# Patient Record
Sex: Male | Born: 1940 | Race: White | Hispanic: No | Smoking: Never smoker
Health system: Southern US, Community
[De-identification: ages and names within clinical notes are randomized; demographics above are authoritative.]

## PROBLEM LIST (undated history)

## (undated) DIAGNOSIS — R109 Unspecified abdominal pain: Secondary | ICD-10-CM

## (undated) DIAGNOSIS — G629 Polyneuropathy, unspecified: Secondary | ICD-10-CM

## (undated) DIAGNOSIS — F329 Major depressive disorder, single episode, unspecified: Secondary | ICD-10-CM

## (undated) DIAGNOSIS — K5790 Diverticulosis of intestine, part unspecified, without perforation or abscess without bleeding: Secondary | ICD-10-CM

## (undated) DIAGNOSIS — C4491 Basal cell carcinoma of skin, unspecified: Secondary | ICD-10-CM

## (undated) DIAGNOSIS — L570 Actinic keratosis: Secondary | ICD-10-CM

## (undated) DIAGNOSIS — M1990 Unspecified osteoarthritis, unspecified site: Secondary | ICD-10-CM

## (undated) DIAGNOSIS — H409 Unspecified glaucoma: Secondary | ICD-10-CM

## (undated) DIAGNOSIS — K900 Celiac disease: Secondary | ICD-10-CM

## (undated) DIAGNOSIS — E039 Hypothyroidism, unspecified: Secondary | ICD-10-CM

## (undated) DIAGNOSIS — E8881 Metabolic syndrome: Secondary | ICD-10-CM

## (undated) DIAGNOSIS — E119 Type 2 diabetes mellitus without complications: Secondary | ICD-10-CM

## (undated) DIAGNOSIS — K219 Gastro-esophageal reflux disease without esophagitis: Secondary | ICD-10-CM

## (undated) DIAGNOSIS — I10 Essential (primary) hypertension: Secondary | ICD-10-CM

## (undated) DIAGNOSIS — E785 Hyperlipidemia, unspecified: Secondary | ICD-10-CM

## (undated) HISTORY — DX: Metabolic syndrome: E88.810

## (undated) HISTORY — PX: CHOLECYSTECTOMY: SHX55

## (undated) HISTORY — DX: Hyperlipidemia, unspecified: E78.5

## (undated) HISTORY — PX: COLONOSCOPY: SHX174

## (undated) HISTORY — DX: Unspecified osteoarthritis, unspecified site: M19.90

## (undated) HISTORY — DX: Unspecified abdominal pain: R10.9

## (undated) HISTORY — DX: Basal cell carcinoma of skin, unspecified: C44.91

## (undated) HISTORY — DX: Diverticulosis of intestine, part unspecified, without perforation or abscess without bleeding: K57.90

## (undated) HISTORY — DX: Essential (primary) hypertension: I10

## (undated) HISTORY — PX: OTHER SURGICAL HISTORY: SHX169

## (undated) HISTORY — DX: Hypothyroidism, unspecified: E03.9

## (undated) HISTORY — DX: Celiac disease: K90.0

## (undated) HISTORY — DX: Gastro-esophageal reflux disease without esophagitis: K21.9

## (undated) HISTORY — DX: Metabolic syndrome: E88.81

## (undated) HISTORY — DX: Type 2 diabetes mellitus without complications: E11.9

## (undated) HISTORY — DX: Major depressive disorder, single episode, unspecified: F32.9

## (undated) HISTORY — PX: ESOPHAGOGASTRODUODENOSCOPY: SHX1529

## (undated) HISTORY — DX: Actinic keratosis: L57.0

---

## 1985-09-23 HISTORY — PX: BACK SURGERY: SHX140

## 1986-09-23 HISTORY — PX: CATARACT EXTRACTION: SUR2

## 2007-09-28 ENCOUNTER — Ambulatory Visit: Payer: Self-pay | Admitting: Cardiology

## 2009-06-19 ENCOUNTER — Encounter: Admission: RE | Admit: 2009-06-19 | Discharge: 2009-06-19 | Payer: Self-pay | Admitting: Internal Medicine

## 2014-04-07 DIAGNOSIS — R0602 Shortness of breath: Secondary | ICD-10-CM | POA: Insufficient documentation

## 2014-07-25 ENCOUNTER — Telehealth: Payer: Self-pay | Admitting: Neurology

## 2014-07-25 ENCOUNTER — Ambulatory Visit: Payer: Medicare Other | Admitting: Neurology

## 2014-07-25 NOTE — Telephone Encounter (Signed)
Patient is a no show for today's appointment(07/25/14)

## 2014-07-27 ENCOUNTER — Encounter: Payer: Self-pay | Admitting: Neurology

## 2014-11-03 DIAGNOSIS — I10 Essential (primary) hypertension: Secondary | ICD-10-CM | POA: Diagnosis not present

## 2014-11-03 DIAGNOSIS — K210 Gastro-esophageal reflux disease with esophagitis: Secondary | ICD-10-CM | POA: Diagnosis not present

## 2014-11-03 DIAGNOSIS — E1165 Type 2 diabetes mellitus with hyperglycemia: Secondary | ICD-10-CM | POA: Diagnosis not present

## 2014-11-03 DIAGNOSIS — E782 Mixed hyperlipidemia: Secondary | ICD-10-CM | POA: Diagnosis not present

## 2014-11-03 DIAGNOSIS — E039 Hypothyroidism, unspecified: Secondary | ICD-10-CM | POA: Diagnosis not present

## 2014-11-09 DIAGNOSIS — E1165 Type 2 diabetes mellitus with hyperglycemia: Secondary | ICD-10-CM | POA: Diagnosis not present

## 2014-11-11 DIAGNOSIS — Z1389 Encounter for screening for other disorder: Secondary | ICD-10-CM | POA: Diagnosis not present

## 2014-11-11 DIAGNOSIS — E782 Mixed hyperlipidemia: Secondary | ICD-10-CM | POA: Diagnosis not present

## 2014-11-11 DIAGNOSIS — Z0001 Encounter for general adult medical examination with abnormal findings: Secondary | ICD-10-CM | POA: Diagnosis not present

## 2015-03-06 DIAGNOSIS — E8881 Metabolic syndrome: Secondary | ICD-10-CM | POA: Diagnosis not present

## 2015-03-06 DIAGNOSIS — E1165 Type 2 diabetes mellitus with hyperglycemia: Secondary | ICD-10-CM | POA: Diagnosis not present

## 2015-03-06 DIAGNOSIS — E039 Hypothyroidism, unspecified: Secondary | ICD-10-CM | POA: Diagnosis not present

## 2015-03-06 DIAGNOSIS — K210 Gastro-esophageal reflux disease with esophagitis: Secondary | ICD-10-CM | POA: Diagnosis not present

## 2015-03-06 DIAGNOSIS — E782 Mixed hyperlipidemia: Secondary | ICD-10-CM | POA: Diagnosis not present

## 2015-03-23 DIAGNOSIS — F331 Major depressive disorder, recurrent, moderate: Secondary | ICD-10-CM | POA: Diagnosis not present

## 2015-03-23 DIAGNOSIS — E8881 Metabolic syndrome: Secondary | ICD-10-CM | POA: Diagnosis not present

## 2015-03-23 DIAGNOSIS — E1165 Type 2 diabetes mellitus with hyperglycemia: Secondary | ICD-10-CM | POA: Diagnosis not present

## 2015-03-23 DIAGNOSIS — E782 Mixed hyperlipidemia: Secondary | ICD-10-CM | POA: Diagnosis not present

## 2015-03-23 DIAGNOSIS — E039 Hypothyroidism, unspecified: Secondary | ICD-10-CM | POA: Diagnosis not present

## 2015-03-29 DIAGNOSIS — G43909 Migraine, unspecified, not intractable, without status migrainosus: Secondary | ICD-10-CM | POA: Diagnosis not present

## 2015-03-29 DIAGNOSIS — G43009 Migraine without aura, not intractable, without status migrainosus: Secondary | ICD-10-CM | POA: Diagnosis not present

## 2015-03-29 DIAGNOSIS — G319 Degenerative disease of nervous system, unspecified: Secondary | ICD-10-CM | POA: Diagnosis not present

## 2015-03-29 DIAGNOSIS — R413 Other amnesia: Secondary | ICD-10-CM | POA: Diagnosis not present

## 2015-04-03 ENCOUNTER — Encounter: Payer: Self-pay | Admitting: Nurse Practitioner

## 2015-04-03 ENCOUNTER — Ambulatory Visit (INDEPENDENT_AMBULATORY_CARE_PROVIDER_SITE_OTHER): Payer: Medicare Other | Admitting: Nurse Practitioner

## 2015-04-03 VITALS — BP 135/71 | HR 57 | Temp 98.4°F | Ht 70.0 in | Wt 193.2 lb

## 2015-04-03 DIAGNOSIS — R197 Diarrhea, unspecified: Secondary | ICD-10-CM | POA: Diagnosis not present

## 2015-04-03 DIAGNOSIS — R194 Change in bowel habit: Secondary | ICD-10-CM

## 2015-04-03 NOTE — Patient Instructions (Signed)
1. We will have he signed a release so we can request your last colonoscopy records from [place]. 2. Continue taking her probiotic as it seems to be helping her symptoms. 3. Return in 4-6 weeks to follow-up on her symptoms talk about whether or not we need to do another colonoscopy.

## 2015-04-03 NOTE — Progress Notes (Signed)
Primary Care Physician:  Gar Ponto, MD Primary Gastroenterologist:  Dr. Gala Romney  Chief Complaint  Patient presents with  . Diarrhea    HPI:   74 year old male presents on referral from primary care. Per the referral and the PCP note, which was reviewed, patient presented to office visit 03/23/2015 with diarrhea from celiac disease which is chronic. He said the gluten-free diet is not working, has had diarrhea multiple times a day without bleeding, his appetite is down and denies fever or chills. Is having 4-6 stools per day. No indication an office note from PCP as when or how celiac disease officially diagnosed. No records and our EMR system of colonoscopy but in [place] states last colonoscopy was "about 9 years ago" at [facility].  Today he states he was diagnosed with celiac by Dr. Quillian Quince after bloodwork. Was having diarrhea 4-6 times a day. Started on Probiotic which has almost eliminated his symptoms. Symptoms were no worse then chronic condition but he was concerned about the color which was a light tan color. Was seen at [facility] for UGI bleeding. Last colonoscopy 2007 at [facility]. Is having increased weakness which he attributes to age.   Past Medical History  Diagnosis Date  . Diabetes   . Abdominal pain   . Actinic keratosis   . Basal cell carcinoma   . Celiac disease   . Diverticulosis   . Essential hypertension   . GERD (gastroesophageal reflux disease)   . Hypothyroidism   . Major depression   . Metabolic syndrome   . Hyperlipidemia   . Osteoarthritis     Past Surgical History  Procedure Laterality Date  . Back surgery  1987  . Eyes surgery    . Cholecystectomy    . Colonoscopy      2007 at [facility]  . Esophagogastroduodenoscopy      2007 at [facility]  . Cataract extraction  1988    Current Outpatient Prescriptions  Medication Sig Dispense Refill  . dorzolamide-timolol (COSOPT) 22.3-6.8 MG/ML ophthalmic solution   0  . losartan (COZAAR) 50 MG tablet  1 Tablet(s) PO daily  0  . sertraline (ZOLOFT) 50 MG tablet take 1 tablet by mouth once daily for depression  0  . SYNTHROID 200 MCG tablet Take 200 mcg by mouth daily before breakfast.   0  . UNABLE TO FIND RAW PROBIOTICS  - 1 DAILY     No current facility-administered medications for this visit.    Allergies as of 04/03/2015 - Review Complete 04/03/2015  Allergen Reaction Noted  . Iodinated diagnostic agents Rash 04/03/2015    Family History  Problem Relation Age of Onset  . CVA Father   . Dementia Mother   . Hyperlipidemia Sister   . Hypothyroidism Sister   . Colon cancer Neg Hx     History   Social History  . Marital Status: Widowed    Spouse Name: N/A  . Number of Children: N/A  . Years of Education: N/A   Occupational History  . Not on file.   Social History Main Topics  . Smoking status: Never Smoker   . Smokeless tobacco: Not on file  . Alcohol Use: No  . Drug Use: No  . Sexual Activity: Not on file   Other Topics Concern  . Not on file   Social History Narrative  . No narrative on file    Review of Systems: General: Negative for anorexia, weight loss, fever, chills. Eyes: Negative for vision changes.  ENT:  Negative for hoarseness, difficulty swallowing. CV: Negative for chest pain, angina, palpitations, peripheral edema.  Respiratory: Negative for dyspnea at rest, cough, sputum, wheezing.  GI: See history of present illness. Derm: Negative for rash or itching.  Endo: Negative for unusual weight change.  Heme: Negative for bruising or bleeding. Allergy: Negative for rash or hives.    Physical Exam: BP 135/71 mmHg  Pulse 57  Temp(Src) 98.4 F (36.9 C) (Oral)  Ht 5\' 10"  (1.778 m)  Wt 193 lb 3.2 oz (87.635 kg)  BMI 27.72 kg/m2 General:   Alert and oriented. Pleasant and cooperative. Well-nourished and well-developed.  Head:  Normocephalic and atraumatic. Eyes:  Without icterus, sclera clear and conjunctiva pink.  Ears:  Normal auditory  acuity. Cardiovascular:  S1, S2 present without murmurs appreciated. Extremities without clubbing or edema. Respiratory:  Clear to auscultation bilaterally. No wheezes, rales, or rhonchi. No distress.  Gastrointestinal:  +BS, soft, non-tender and non-distended. No HSM noted. No guarding or rebound. No masses appreciated.  Rectal:  Deferred  Skin:  Intact without significant lesions or rashes. Neurologic:  Alert and oriented x4;  grossly normal neurologically. Psych:  Alert and cooperative. Normal mood and affect. Heme/Lymph/Immune: No excessive bruising noted.    04/03/2015 12:55 PM

## 2015-04-03 NOTE — Assessment & Plan Note (Signed)
74 year old male with a history of chronic diarrhea which is changed to the past few months to become more frequent and persistent as well as change in color. Last colonoscopy approximately 9 years ago at [facility]. We'll request his records to see the results of his last colonoscopy. Probiotic is essentially resolved his diarrhea symptoms were now but is concerned N/A return. We'll have him return in 4-6 weeks to follow-up on symptoms on chronic probiotic as well as discussed possible need for colonoscopy given results of his last procedure.

## 2015-04-03 NOTE — Assessment & Plan Note (Signed)
74 year old male with a history of celiac disease diagnosed several years ago. Has chronic diarrhea that he states this is been worse over the past couple few months and change in color to light and. He began taking a probiotic about a week ago which is all but resolved his symptoms. Last colonoscopy about 9 years ago at [facility]. Denies any red flag/warning signs or symptoms. At this point we'll request the records of his previous colonoscopy, and have him come back in 4-6 weeks to reevaluate his symptoms on prolonged probiotic and discussed possible need for colonoscopy given symptom presentation and change in bowel habits based on last colonoscopy results.

## 2015-04-07 NOTE — Progress Notes (Signed)
CC'ED TO PCP 

## 2015-05-01 ENCOUNTER — Ambulatory Visit (INDEPENDENT_AMBULATORY_CARE_PROVIDER_SITE_OTHER): Payer: Medicare Other | Admitting: Nurse Practitioner

## 2015-05-01 ENCOUNTER — Other Ambulatory Visit: Payer: Self-pay

## 2015-05-01 ENCOUNTER — Encounter: Payer: Self-pay | Admitting: Nurse Practitioner

## 2015-05-01 VITALS — BP 138/75 | HR 57 | Temp 97.1°F | Ht 71.0 in | Wt 190.6 lb

## 2015-05-01 DIAGNOSIS — R194 Change in bowel habit: Secondary | ICD-10-CM

## 2015-05-01 DIAGNOSIS — R197 Diarrhea, unspecified: Secondary | ICD-10-CM

## 2015-05-01 DIAGNOSIS — R198 Other specified symptoms and signs involving the digestive system and abdomen: Secondary | ICD-10-CM | POA: Diagnosis not present

## 2015-05-01 LAB — CBC WITH DIFFERENTIAL/PLATELET
BASOS ABS: 0 10*3/uL (ref 0.0–0.1)
BASOS PCT: 0 % (ref 0–1)
EOS ABS: 0.3 10*3/uL (ref 0.0–0.7)
Eosinophils Relative: 4 % (ref 0–5)
HEMATOCRIT: 42.4 % (ref 39.0–52.0)
HEMOGLOBIN: 14.9 g/dL (ref 13.0–17.0)
LYMPHS ABS: 2.3 10*3/uL (ref 0.7–4.0)
LYMPHS PCT: 35 % (ref 12–46)
MCH: 29.9 pg (ref 26.0–34.0)
MCHC: 35.1 g/dL (ref 30.0–36.0)
MCV: 85 fL (ref 78.0–100.0)
MONO ABS: 0.8 10*3/uL (ref 0.1–1.0)
MPV: 9.6 fL (ref 8.6–12.4)
Monocytes Relative: 12 % (ref 3–12)
NEUTROS ABS: 3.3 10*3/uL (ref 1.7–7.7)
Neutrophils Relative %: 49 % (ref 43–77)
PLATELETS: 162 10*3/uL (ref 150–400)
RBC: 4.99 MIL/uL (ref 4.22–5.81)
RDW: 13.7 % (ref 11.5–15.5)
WBC: 6.7 10*3/uL (ref 4.0–10.5)

## 2015-05-01 MED ORDER — PEG 3350-KCL-NA BICARB-NACL 420 G PO SOLR: 4000.0000 mL | Freq: Once | ORAL | Status: DC

## 2015-05-01 NOTE — Assessment & Plan Note (Signed)
74 year old male with history of colonoscopy approximately 9 years ago at [facility] which we have not received records 4. We will re-request these records. His diarrhea initially had improvement on probiotics for about 2 weeks, however the effectiveness since worn off. Stool studies have not been done yet. Denies red flag/warning signs or symptoms including hematochezia, melena, severe intractable pain, severe intractable nausea and vomiting. He has a chronic history of diarrhea however this is become worse in the past 3-4 months. At this point we'll check stool studies, CBC, tissue transglutaminase IgA, and total IgA as he has a questionable history of celiac disease. We will also plan for colonoscopy in the OR on propofol due to a previous poor experience with conscious sedation.  Proceed with TCS in the OR on propofol with Dr. Gala Romney in near future: the risks, benefits, and alternatives have been discussed with the patient in detail. The patient states understanding and desires to proceed.  Patient is not on any anticoagulants. He is on Zoloft 50 mg daily. He is not on any chronic pain medicines or anxiolytics. He has had a previous poor experience with sedation and for this reason we'll proceed with procedure and the OR on propofol/MAC.

## 2015-05-01 NOTE — Progress Notes (Signed)
CC'ED TO PCP 

## 2015-05-01 NOTE — Progress Notes (Signed)
Referring Provider: Caryl Bis, MD Primary Care Physician:  Gar Ponto, MD Primary GI:  Dr. Gala Romney  Chief Complaint  Patient presents with  . Follow-up    still having pain and diarrhea  . Diarrhea  . Abdominal Pain    HPI:   74 year old male for follow-up on diarrhea and bowel habit changes. There is a questionable history of celiac disease. Gluten-free diet and multiple over-the-counter medications have not helped his symptoms. He was initially improving on a probiotic at last visit, however this proved to be temporary. Is having 3-6 loose stools a day. Last colonoscopy approximately 9 years ago per patient, these records were requested but had not been received. We will request these again. He has a history of chronic diarrhea, however this drastically worsened in severity over the past 3-6 months.   Today he states the probiotic was effective for about 2 weeks then his symptoms returned. Will have fecal urgency as well. Has several accidents a day. Admits abdominal pain in the mid to lower abdomen as well as in the rectum. Denies hematochezia, melena, N/V. Has loose stools 2-5 times a day.  has tried avoiding gluten without improvement. Denies GERD symptoms. Denies fever, chills, chest pain, dizziness, lightheadedness, syncope, near syncope. Denies any other upper or lower GI symptoms.   Past Medical History  Diagnosis Date  . Diabetes   . Abdominal pain   . Actinic keratosis   . Basal cell carcinoma   . Celiac disease   . Diverticulosis   . Essential hypertension   . GERD (gastroesophageal reflux disease)   . Hypothyroidism   . Major depression   . Metabolic syndrome   . Hyperlipidemia   . Osteoarthritis     Past Surgical History  Procedure Laterality Date  . Back surgery  1987  . Eyes surgery    . Cholecystectomy    . Colonoscopy      2007 at [facility]  . Esophagogastroduodenoscopy      2007 at [facility]  . Cataract extraction  1988    Current  Outpatient Prescriptions  Medication Sig Dispense Refill  . dorzolamide-timolol (COSOPT) 22.3-6.8 MG/ML ophthalmic solution   0  . latanoprost (XALATAN) 0.005 % ophthalmic solution   0  . losartan (COZAAR) 50 MG tablet 1 Tablet(s) PO daily  0  . sertraline (ZOLOFT) 50 MG tablet take 1 tablet by mouth once daily for depression  0  . SYNTHROID 200 MCG tablet Take 150 mcg by mouth daily before breakfast.   0  . UNABLE TO FIND RAW PROBIOTICS  - 1 DAILY     No current facility-administered medications for this visit.    Allergies as of 05/01/2015 - Review Complete 05/01/2015  Allergen Reaction Noted  . Iodinated diagnostic agents Rash 04/03/2015    Family History  Problem Relation Age of Onset  . CVA Father   . Dementia Mother   . Hyperlipidemia Sister   . Hypothyroidism Sister   . Colon cancer Neg Hx     History   Social History  . Marital Status: Widowed    Spouse Name: N/A  . Number of Children: N/A  . Years of Education: N/A   Social History Main Topics  . Smoking status: Never Smoker   . Smokeless tobacco: Not on file  . Alcohol Use: No  . Drug Use: No  . Sexual Activity: Not on file   Other Topics Concern  . None   Social History Narrative  Review of Systems: General: Negative for anorexia, weight loss, fever, chills. Eyes: Negative for vision changes.  ENT: Negative for hoarseness, difficulty swallowing. CV: Negative for chest pain, angina, palpitations, peripheral edema.  Respiratory: Negative for dyspnea at rest, cough, sputum, wheezing.  GI: See history of present illness. Derm: Negative for rash or itching.  Endo: Negative for unusual weight change.  Heme: Negative for bruising or bleeding. Allergy: Negative for rash or hives.   Physical Exam: BP 138/75 mmHg  Pulse 57  Temp(Src) 97.1 F (36.2 C) (Oral)  Ht 5\' 11"  (1.803 m)  Wt 190 lb 9.6 oz (86.456 kg)  BMI 26.60 kg/m2 General:   Alert and oriented. Pleasant and cooperative.  Well-nourished and well-developed. Appears uncomfortable. Head:  Normocephalic and atraumatic. Eyes:  Without icterus, sclera clear and conjunctiva pink.  Ears:  Normal auditory acuity. Cardiovascular:  S1, S2 present without murmurs appreciated. Normal pulses noted. Extremities without clubbing or edema. Respiratory:  Clear to auscultation bilaterally. No wheezes, rales, or rhonchi. No distress.  Gastrointestinal:  +BS, soft, and non-distended. Some increased TTP LLQ. No HSM noted. No guarding or rebound. No masses appreciated.  Rectal:  Deferred  Neurologic:  Alert and oriented x4;  grossly normal neurologically. Psych:  Alert and cooperative. Normal mood and affect. Heme/Lymph/Immune: No excessive bruising noted.    05/01/2015 11:28 AM

## 2015-05-01 NOTE — Patient Instructions (Signed)
1. Take her stool samples to the lab when you're able to collect them. 2. He can have your blood work done when he brings samples lab.  3. We will schedule your procedure for you. 4. Return for follow-up in 3 months.

## 2015-05-01 NOTE — Assessment & Plan Note (Signed)
74 year old male with history of colonoscopy approximately 9 years ago at [facility] which we have not received records 4. We will re-request these records. His diarrhea initially had improvement on probiotics for about 2 weeks, however the effectiveness since worn off. Stool studies have not been done yet. Denies red flag/warning signs or symptoms including hematochezia, melena, severe intractable pain, severe intractable nausea and vomiting. He has a chronic history of diarrhea however this is become worse in the past 3-4 months. At this point we'll check stool studies, CBC, tissue transglutaminase IgA, and total IgA as he has a questionable history of celiac disease. We will also plan for colonoscopy in the OR on propofol due to a previous poor experience with conscious sedation.  Proceed with TCS in the OR on propofol with Dr. Gala Romney in near future: the risks, benefits, and alternatives have been discussed with the patient in detail. The patient states understanding and desires to proceed.  Patient is not on any anticoagulants. He is on Zoloft 50 mg daily. He is not on any chronic pain medicines or anxiolytics. He has had a previous poor experience with sedation and for this reason we'll proceed with procedure and the OR on propofol/MAC.

## 2015-05-02 LAB — CLOSTRIDIUM DIFFICILE BY PCR: Toxigenic C. Difficile by PCR: NOT DETECTED

## 2015-05-02 LAB — IGA: IgA: 552 mg/dL — ABNORMAL HIGH (ref 68–379)

## 2015-05-03 LAB — GIARDIA ANTIGEN: Giardia Screen (EIA): NEGATIVE

## 2015-05-03 LAB — TISSUE TRANSGLUTAMINASE, IGA: Tissue Transglutaminase Ab, IgA: 1 U/mL (ref <4)

## 2015-05-04 DIAGNOSIS — Z961 Presence of intraocular lens: Secondary | ICD-10-CM | POA: Diagnosis not present

## 2015-05-05 DIAGNOSIS — E039 Hypothyroidism, unspecified: Secondary | ICD-10-CM | POA: Diagnosis not present

## 2015-05-05 LAB — STOOL CULTURE

## 2015-05-18 NOTE — Patient Instructions (Signed)
[facility]  05/18/2015     @PREFPERIOPPHARMACY @   Your procedure is scheduled on  05/25/2015   Report to [facility] at  6  A.M.  Call this number if you have problems the morning of surgery:  [phone]   Remember:  Do not eat food or drink liquids after midnight.  Take these medicines the morning of surgery with A SIP OF WATER  Losartan, zoloft, synthroid.   Do not wear jewelry, make-up or nail polish.  Do not wear lotions, powders, or perfumes.  You may wear deodorant.  Do not shave 48 hours prior to surgery.  Men may shave face and neck.  Do not bring valuables to the [facility].  [facility] is not responsible for any belongings or valuables.  Contacts, dentures or bridgework may not be worn into surgery.  Leave your suitcase in the car.  After surgery it may be brought to your room.  For patients admitted to the [facility], discharge time will be determined by your treatment team.  Patients discharged the day of surgery will not be allowed to drive home.   Name and phone number of your driver:   Family    Special instructions:  Follow the prep and diet instructions given to you by Dr Roseanne Kaufman office.  Please read over the following fact sheets that you were given. Pain Booklet, Coughing and Deep Breathing, Surgical Site Infection Prevention, Anesthesia Post-op Instructions and Care and Recovery After Surgery      Colonoscopy A colonoscopy is an exam to look at the entire large intestine (colon). This exam can help find problems such as tumors, polyps, inflammation, and areas of bleeding. The exam takes about 1 hour.  LET [facility] CARE [facility] KNOW ABOUT:   Any allergies you have.  All medicines you are taking, including vitamins, herbs, eye drops, creams, and over-the-counter medicines.  Previous problems you or members of your family have had with the use of anesthetics.  Any blood disorders you have.  Previous surgeries you have had.  Medical  conditions you have. RISKS AND COMPLICATIONS  Generally, this is a safe procedure. However, as with any procedure, complications can occur. Possible complications include:  Bleeding.  Tearing or rupture of the colon wall.  Reaction to medicines given during the exam.  Infection (rare). BEFORE THE PROCEDURE   Ask your health care [facility] about changing or stopping your regular medicines.  You may be prescribed an oral bowel prep. This involves drinking a large amount of medicated liquid, starting the day before your procedure. The liquid will cause you to have multiple loose stools until your stool is almost clear or light green. This cleans out your colon in preparation for the procedure.  Do not eat or drink anything else once you have started the bowel prep, unless your health care [facility] tells you it is safe to do so.  Arrange for someone to drive you home after the procedure. PROCEDURE   You will be given medicine to help you relax (sedative).  You will lie on your side with your knees bent.  A long, flexible tube with a light and camera on the end (colonoscope) will be inserted through the rectum and into the colon. The camera sends video back to a computer screen as it moves through the colon. The colonoscope also releases carbon dioxide gas to inflate the colon. This helps your health care [facility] see the area better.  During the exam,  your health care [facility] may take a small tissue sample (biopsy) to be examined under a microscope if any abnormalities are found.  The exam is finished when the entire colon has been viewed. AFTER THE PROCEDURE   Do not drive for 24 hours after the exam.  You may have a small amount of blood in your stool.  You may pass moderate amounts of gas and have mild abdominal cramping or bloating. This is caused by the gas used to inflate your colon during the exam.  Ask when your test results will be ready and how you will get your results.  Make sure you get your test results. Document Released: 09/06/2000 Document Revised: 06/30/2013 Document Reviewed: 05/17/2013 [facility] Patient Information 2015 [place], [place]. This information is not intended to replace advice given to you by your health care [facility]. Make sure you discuss any questions you have with your health care [facility]. PATIENT INSTRUCTIONS POST-ANESTHESIA  IMMEDIATELY FOLLOWING SURGERY:  Do not drive or operate machinery for the first twenty four hours after surgery.  Do not make any important decisions for twenty four hours after surgery or while taking narcotic pain medications or sedatives.  If you develop intractable nausea and vomiting or a severe headache please notify your doctor immediately.  FOLLOW-UP:  Please make an appointment with your surgeon as instructed. You do not need to follow up with anesthesia unless specifically instructed to do so.  WOUND CARE INSTRUCTIONS (if applicable):  Keep a dry clean dressing on the anesthesia/puncture wound site if there is drainage.  Once the wound has quit draining you may leave it open to air.  Generally you should leave the bandage intact for twenty four hours unless there is drainage.  If the epidural site drains for more than 36-48 hours please call the anesthesia department.  QUESTIONS?:  Please feel free to call your physician or the [facility] operator if you have any questions, and they will be happy to assist you.

## 2015-05-19 ENCOUNTER — Encounter (HOSPITAL_COMMUNITY): Payer: Self-pay

## 2015-05-19 ENCOUNTER — Encounter (HOSPITAL_COMMUNITY)
Admission: RE | Admit: 2015-05-19 | Discharge: 2015-05-19 | Disposition: A | Discharge status: Home / Self Care | Payer: Medicare Other | Source: Ambulatory Visit | Attending: Internal Medicine | Admitting: Internal Medicine

## 2015-05-19 ENCOUNTER — Other Ambulatory Visit: Payer: Self-pay

## 2015-05-19 DIAGNOSIS — Z01818 Encounter for other preprocedural examination: Secondary | ICD-10-CM | POA: Insufficient documentation

## 2015-05-19 DIAGNOSIS — R197 Diarrhea, unspecified: Secondary | ICD-10-CM | POA: Insufficient documentation

## 2015-05-19 LAB — CBC
HCT: 42.5 % (ref 39.0–52.0)
HEMOGLOBIN: 15 g/dL (ref 13.0–17.0)
MCH: 30 pg (ref 26.0–34.0)
MCHC: 35.3 g/dL (ref 30.0–36.0)
MCV: 85 fL (ref 78.0–100.0)
Platelets: 132 10*3/uL — ABNORMAL LOW (ref 150–400)
RBC: 5 MIL/uL (ref 4.22–5.81)
RDW: 13.2 % (ref 11.5–15.5)
WBC: 6.3 10*3/uL (ref 4.0–10.5)

## 2015-05-19 LAB — BASIC METABOLIC PANEL
ANION GAP: 7 (ref 5–15)
BUN: 12 mg/dL (ref 6–20)
CHLORIDE: 104 mmol/L (ref 101–111)
CO2: 22 mmol/L (ref 22–32)
Calcium: 8.7 mg/dL — ABNORMAL LOW (ref 8.9–10.3)
Creatinine, Ser: 0.84 mg/dL (ref 0.61–1.24)
GFR calc Af Amer: >60 mL/min (ref >60)
GFR calc non Af Amer: >60 mL/min (ref >60)
Glucose, Bld: 256 mg/dL — ABNORMAL HIGH (ref 65–99)
POTASSIUM: 4 mmol/L (ref 3.5–5.1)
SODIUM: 133 mmol/L — AB (ref 135–145)

## 2015-05-25 ENCOUNTER — Encounter (HOSPITAL_COMMUNITY)
Admission: RE | Disposition: A | Discharge status: Home / Self Care | Payer: Self-pay | Source: Ambulatory Visit | Attending: Internal Medicine

## 2015-05-25 ENCOUNTER — Ambulatory Visit (HOSPITAL_COMMUNITY): Payer: Medicare Other | Admitting: Anesthesiology

## 2015-05-25 ENCOUNTER — Ambulatory Visit (HOSPITAL_COMMUNITY)
Admission: RE | Admit: 2015-05-25 | Discharge: 2015-05-25 | Disposition: A | Discharge status: Home / Self Care | Payer: Medicare Other | Source: Ambulatory Visit | Attending: Internal Medicine | Admitting: Internal Medicine

## 2015-05-25 ENCOUNTER — Encounter (HOSPITAL_COMMUNITY): Payer: Self-pay | Admitting: *Deleted

## 2015-05-25 DIAGNOSIS — K5730 Diverticulosis of large intestine without perforation or abscess without bleeding: Secondary | ICD-10-CM

## 2015-05-25 DIAGNOSIS — R109 Unspecified abdominal pain: Secondary | ICD-10-CM | POA: Insufficient documentation

## 2015-05-25 DIAGNOSIS — R197 Diarrhea, unspecified: Secondary | ICD-10-CM | POA: Insufficient documentation

## 2015-05-25 DIAGNOSIS — R1310 Dysphagia, unspecified: Secondary | ICD-10-CM | POA: Diagnosis not present

## 2015-05-25 DIAGNOSIS — Z79899 Other long term (current) drug therapy: Secondary | ICD-10-CM | POA: Diagnosis not present

## 2015-05-25 DIAGNOSIS — K529 Noninfective gastroenteritis and colitis, unspecified: Secondary | ICD-10-CM | POA: Diagnosis not present

## 2015-05-25 DIAGNOSIS — I10 Essential (primary) hypertension: Secondary | ICD-10-CM | POA: Insufficient documentation

## 2015-05-25 DIAGNOSIS — E119 Type 2 diabetes mellitus without complications: Secondary | ICD-10-CM | POA: Diagnosis not present

## 2015-05-25 DIAGNOSIS — E785 Hyperlipidemia, unspecified: Secondary | ICD-10-CM | POA: Diagnosis not present

## 2015-05-25 DIAGNOSIS — Z8719 Personal history of other diseases of the digestive system: Secondary | ICD-10-CM | POA: Insufficient documentation

## 2015-05-25 DIAGNOSIS — F329 Major depressive disorder, single episode, unspecified: Secondary | ICD-10-CM | POA: Diagnosis not present

## 2015-05-25 DIAGNOSIS — E039 Hypothyroidism, unspecified: Secondary | ICD-10-CM | POA: Diagnosis not present

## 2015-05-25 DIAGNOSIS — Z85828 Personal history of other malignant neoplasm of skin: Secondary | ICD-10-CM | POA: Diagnosis not present

## 2015-05-25 DIAGNOSIS — K219 Gastro-esophageal reflux disease without esophagitis: Secondary | ICD-10-CM | POA: Diagnosis not present

## 2015-05-25 HISTORY — PX: COLONOSCOPY WITH PROPOFOL: SHX5780

## 2015-05-25 HISTORY — PX: BIOPSY: SHX5522

## 2015-05-25 LAB — GLUCOSE, CAPILLARY: GLUCOSE-CAPILLARY: 111 mg/dL — AB (ref 65–99)

## 2015-05-25 SURGERY — COLONOSCOPY WITH PROPOFOL
Anesthesia: Monitor Anesthesia Care

## 2015-05-25 MED ORDER — LIDOCAINE HCL (CARDIAC) 10 MG/ML IV SOLN
INTRAVENOUS | Status: DC | PRN
  Administered 2015-05-25: 50 mg via INTRAVENOUS

## 2015-05-25 MED ORDER — LACTATED RINGERS IV SOLN
INTRAVENOUS | Status: DC
  Administered 2015-05-25 (×2): via INTRAVENOUS

## 2015-05-25 MED ORDER — FENTANYL CITRATE (PF) 100 MCG/2ML IJ SOLN
25.0000 ug | INTRAMUSCULAR | Status: AC
  Administered 2015-05-25: 25 ug via INTRAVENOUS

## 2015-05-25 MED ORDER — MIDAZOLAM HCL 2 MG/2ML IJ SOLN
INTRAMUSCULAR | Status: AC
  Filled 2015-05-25: qty 2

## 2015-05-25 MED ORDER — PROPOFOL 10 MG/ML IV BOLUS
INTRAVENOUS | Status: AC
  Filled 2015-05-25: qty 20

## 2015-05-25 MED ORDER — MIDAZOLAM HCL 2 MG/2ML IJ SOLN
1.0000 mg | INTRAMUSCULAR | Status: DC | PRN
  Administered 2015-05-25: 2 mg via INTRAVENOUS

## 2015-05-25 MED ORDER — PROPOFOL INFUSION 10 MG/ML OPTIME
INTRAVENOUS | Status: DC | PRN
  Administered 2015-05-25: 200 ug/kg/min via INTRAVENOUS
  Administered 2015-05-25: 100 ug/kg/min via INTRAVENOUS

## 2015-05-25 MED ORDER — FENTANYL CITRATE (PF) 100 MCG/2ML IJ SOLN
INTRAMUSCULAR | Status: AC
  Filled 2015-05-25: qty 4

## 2015-05-25 MED ORDER — MIDAZOLAM HCL 2 MG/2ML IJ SOLN
INTRAMUSCULAR | Status: AC
  Filled 2015-05-25: qty 4

## 2015-05-25 MED ORDER — ONDANSETRON HCL 4 MG/2ML IJ SOLN
INTRAMUSCULAR | Status: AC
  Filled 2015-05-25: qty 2

## 2015-05-25 MED ORDER — FENTANYL CITRATE (PF) 100 MCG/2ML IJ SOLN
INTRAMUSCULAR | Status: AC
  Filled 2015-05-25: qty 2

## 2015-05-25 MED ORDER — ONDANSETRON HCL 4 MG/2ML IJ SOLN
4.0000 mg | Freq: Once | INTRAMUSCULAR | Status: AC
  Administered 2015-05-25: 4 mg via INTRAVENOUS

## 2015-05-25 MED ORDER — LIDOCAINE HCL (PF) 1 % IJ SOLN
INTRAMUSCULAR | Status: AC
  Filled 2015-05-25: qty 15

## 2015-05-25 MED ORDER — STERILE WATER FOR IRRIGATION IR SOLN
Status: DC | PRN
  Administered 2015-05-25: 1000 mL

## 2015-05-25 SURGICAL SUPPLY — 21 items
ELECT REM PT RETURN 9FT ADLT (ELECTROSURGICAL)
ELECTRODE REM PT RTRN 9FT ADLT (ELECTROSURGICAL) IMPLANT
FCP BXJMBJMB 240X2.8X (CUTTING FORCEPS)
FLOOR PAD 36X40 (MISCELLANEOUS)
FORCEPS BIOP RAD 4 LRG CAP 4 (CUTTING FORCEPS) ×3 IMPLANT
FORCEPS BIOP RJ4 240 W/NDL (CUTTING FORCEPS)
FORCEPS BXJMBJMB 240X2.8X (CUTTING FORCEPS) IMPLANT
FORMALIN 10 PREFIL 20ML (MISCELLANEOUS) ×6 IMPLANT
INJECTOR/SNARE I SNARE (MISCELLANEOUS) IMPLANT
KIT ENDO PROCEDURE PEN (KITS) ×3 IMPLANT
MANIFOLD NEPTUNE II (INSTRUMENTS) ×3 IMPLANT
NEEDLE SCLEROTHERAPY 25GX240 (NEEDLE) IMPLANT
PAD FLOOR 36X40 (MISCELLANEOUS) IMPLANT
PROBE APC STR FIRE (PROBE) IMPLANT
PROBE INJECTION GOLD (MISCELLANEOUS)
PROBE INJECTION GOLD 7FR (MISCELLANEOUS) IMPLANT
SNARE ROTATE MED OVAL 20MM (MISCELLANEOUS) IMPLANT
SNARE SHORT THROW 13M SML OVAL (MISCELLANEOUS) IMPLANT
TRAP SPECIMEN MUCOUS 40CC (MISCELLANEOUS) IMPLANT
TUBING IRRIGATION ENDOGATOR (MISCELLANEOUS) ×3 IMPLANT
WATER STERILE IRR 1000ML POUR (IV SOLUTION) ×3 IMPLANT

## 2015-05-25 NOTE — Anesthesia Procedure Notes (Signed)
Procedure Name: MAC Date/Time: 05/25/2015 8:54 AM Performed by: [facility] Pre-anesthesia Checklist: Patient identified, Emergency Drugs available, Suction available, Timeout performed and Patient being monitored Patient Re-evaluated:Patient Re-evaluated prior to inductionOxygen Delivery Method: Non-rebreather mask

## 2015-05-25 NOTE — H&P (View-Only) (Signed)
  Referring Provider: Daniel, Terry G, MD Primary Care Physician:  DANIEL, TERRY, MD Primary GI:  Dr. Rourk  Chief Complaint  Patient presents with  . Follow-up    still having pain and diarrhea  . Diarrhea  . Abdominal Pain    HPI:   74 year old male for follow-up on diarrhea and bowel habit changes. There is a questionable history of celiac disease. Gluten-free diet and multiple over-the-counter medications have not helped his symptoms. He was initially improving on a probiotic at last visit, however this proved to be temporary. Is having 3-6 loose stools a day. Last colonoscopy approximately 9 years ago per patient, these records were requested but had not been received. We will request these again. He has a history of chronic diarrhea, however this drastically worsened in severity over the past 3-6 months.   Today he states the probiotic was effective for about 2 weeks then his symptoms returned. Will have fecal urgency as well. Has several accidents a day. Admits abdominal pain in the mid to lower abdomen as well as in the rectum. Denies hematochezia, melena, N/V. Has loose stools 2-5 times a day.  has tried avoiding gluten without improvement. Denies GERD symptoms. Denies fever, chills, chest pain, dizziness, lightheadedness, syncope, near syncope. Denies any other upper or lower GI symptoms.   Past Medical History  Diagnosis Date  . Diabetes   . Abdominal pain   . Actinic keratosis   . Basal cell carcinoma   . Celiac disease   . Diverticulosis   . Essential hypertension   . GERD (gastroesophageal reflux disease)   . Hypothyroidism   . Major depression   . Metabolic syndrome   . Hyperlipidemia   . Osteoarthritis     Past Surgical History  Procedure Laterality Date  . Back surgery  1987  . Eyes surgery    . Cholecystectomy    . Colonoscopy      2007 at [facility]  . Esophagogastroduodenoscopy      2007 at [facility]  . Cataract extraction  1988    Current  Outpatient Prescriptions  Medication Sig Dispense Refill  . dorzolamide-timolol (COSOPT) 22.3-6.8 MG/ML ophthalmic solution   0  . latanoprost (XALATAN) 0.005 % ophthalmic solution   0  . losartan (COZAAR) 50 MG tablet 1 Tablet(s) PO daily  0  . sertraline (ZOLOFT) 50 MG tablet take 1 tablet by mouth once daily for depression  0  . SYNTHROID 200 MCG tablet Take 150 mcg by mouth daily before breakfast.   0  . UNABLE TO FIND RAW PROBIOTICS  - 1 DAILY     No current facility-administered medications for this visit.    Allergies as of 05/01/2015 - Review Complete 05/01/2015  Allergen Reaction Noted  . Iodinated diagnostic agents Rash 04/03/2015    Family History  Problem Relation Age of Onset  . CVA Father   . Dementia Mother   . Hyperlipidemia Sister   . Hypothyroidism Sister   . Colon cancer Neg Hx     History   Social History  . Marital Status: Widowed    Spouse Name: N/A  . Number of Children: N/A  . Years of Education: N/A   Social History Main Topics  . Smoking status: Never Smoker   . Smokeless tobacco: Not on file  . Alcohol Use: No  . Drug Use: No  . Sexual Activity: Not on file   Other Topics Concern  . None   Social History Narrative      Review of Systems: General: Negative for anorexia, weight loss, fever, chills. Eyes: Negative for vision changes.  ENT: Negative for hoarseness, difficulty swallowing. CV: Negative for chest pain, angina, palpitations, peripheral edema.  Respiratory: Negative for dyspnea at rest, cough, sputum, wheezing.  GI: See history of present illness. Derm: Negative for rash or itching.  Endo: Negative for unusual weight change.  Heme: Negative for bruising or bleeding. Allergy: Negative for rash or hives.   Physical Exam: BP 138/75 mmHg  Pulse 57  Temp(Src) 97.1 F (36.2 C) (Oral)  Ht 5' 11" (1.803 m)  Wt 190 lb 9.6 oz (86.456 kg)  BMI 26.60 kg/m2 General:   Alert and oriented. Pleasant and cooperative.  Well-nourished and well-developed. Appears uncomfortable. Head:  Normocephalic and atraumatic. Eyes:  Without icterus, sclera clear and conjunctiva pink.  Ears:  Normal auditory acuity. Cardiovascular:  S1, S2 present without murmurs appreciated. Normal pulses noted. Extremities without clubbing or edema. Respiratory:  Clear to auscultation bilaterally. No wheezes, rales, or rhonchi. No distress.  Gastrointestinal:  +BS, soft, and non-distended. Some increased TTP LLQ. No HSM noted. No guarding or rebound. No masses appreciated.  Rectal:  Deferred  Neurologic:  Alert and oriented x4;  grossly normal neurologically. Psych:  Alert and cooperative. Normal mood and affect. Heme/Lymph/Immune: No excessive bruising noted.    05/01/2015 11:28 AM  

## 2015-05-25 NOTE — Anesthesia Postprocedure Evaluation (Signed)
Anesthesia Post Note  Patient: Kenneth Santiago  Procedure(s) Performed: Procedure(s) (LRB): COLONOSCOPY WITH PROPOFOL (N/A) BIOPSY (N/A)  Anesthesia type: MAC  Patient location: PACU  Post pain: Pain level controlled  Post assessment: Post-op Vital signs reviewed, Patient's Cardiovascular Status Stable, Respiratory Function Stable, Patent Airway, No signs of Nausea or vomiting and Pain level controlled    Post vital signs: Reviewed and stable  Level of consciousness: awake and alert   Complications: No apparent anesthesia complications

## 2015-05-25 NOTE — Transfer of Care (Signed)
Immediate Anesthesia Transfer of Care Note  Patient: Kenneth Santiago  Procedure(s) Performed: Procedure(s) (LRB): COLONOSCOPY WITH PROPOFOL (N/A) BIOPSY (N/A)  Patient Location: PACU  Anesthesia Type: MAC  Level of Consciousness: awake  Airway & Oxygen Therapy: Patient Spontanous Breathing. Nasal cannula  Post-op Assessment: Report given to PACU RN, Post -op Vital signs reviewed and stable and Patient moving all extremities  Post vital signs: Reviewed and stable  Complications: No apparent anesthesia complications

## 2015-05-25 NOTE — Discharge Instructions (Signed)
Colonoscopy Discharge Instructions  Read the instructions outlined below and refer to this sheet in the next few weeks. These discharge instructions provide you with general information on caring for yourself after you leave the hospital. Your doctor may also give you specific instructions. While your treatment has been planned according to the most current medical practices available, unavoidable complications occasionally occur. If you have any problems or questions after discharge, call Dr. Gala Romney at [phone]. ACTIVITY  You may resume your regular activity, but move at a slower pace for the next 24 hours.   Take frequent rest periods for the next 24 hours.   Walking will help get rid of the air and reduce the bloated feeling in your belly (abdomen).   No driving for 24 hours (because of the medicine (anesthesia) used during the test).    Do not sign any important legal documents or operate any machinery for 24 hours (because of the anesthesia used during the test).  NUTRITION  Drink plenty of fluids.   You may resume your normal diet as instructed by your doctor.   Begin with a light meal and progress to your normal diet. Heavy or fried foods are harder to digest and may make you feel sick to your stomach (nauseated).   Avoid alcoholic beverages for 24 hours or as instructed.  MEDICATIONS  You may resume your normal medications unless your doctor tells you otherwise.  WHAT YOU CAN EXPECT TODAY  Some feelings of bloating in the abdomen.   Passage of more gas than usual.   Spotting of blood in your stool or on the toilet paper.  IF YOU HAD POLYPS REMOVED DURING THE COLONOSCOPY:  No aspirin products for 7 days or as instructed.   No alcohol for 7 days or as instructed.   Eat a soft diet for the next 24 hours.  FINDING OUT THE RESULTS OF YOUR TEST Not all test results are available during your visit. If your test results are not back during the visit, make an appointment  with your caregiver to find out the results. Do not assume everything is normal if you have not heard from your caregiver or the medical facility. It is important for you to follow up on all of your test results.  SEEK IMMEDIATE MEDICAL ATTENTION IF:  You have more than a spotting of blood in your stool.   Your belly is swollen (abdominal distention).   You are nauseated or vomiting.   You have a temperature over 101.   You have abdominal pain or discomfort that is severe or gets worse throughout the day.     You had diverticulosis in your colon. No evidence of polyp or cancer. Biopsies have been taken.  Diverticulosis information provided  Further recommendations to follow pending review of pathology report   Diverticulosis Diverticulosis is the condition that develops when small pouches (diverticula) form in the wall of your colon. Your colon, or large intestine, is where water is absorbed and stool is formed. The pouches form when the inside layer of your colon pushes through weak spots in the outer layers of your colon. CAUSES  No one knows exactly what causes diverticulosis. RISK FACTORS  Being older than 58. Your risk for this condition increases with age. Diverticulosis is rare in people younger than 40 years. By age 59, almost everyone has it.  Eating a low-fiber diet.  Being frequently constipated.  Being overweight.  Not getting enough exercise.  Smoking.  Taking over-the-counter pain medicines,  like aspirin and ibuprofen. SYMPTOMS  Most people with diverticulosis do not have symptoms. DIAGNOSIS  Because diverticulosis often has no symptoms, health care providers often discover the condition during an exam for other colon problems. In many cases, a health care provider will diagnose diverticulosis while using a flexible scope to examine the colon (colonoscopy). TREATMENT  If you have never developed an infection related to diverticulosis, you may not need  treatment. If you have had an infection before, treatment may include:  Eating more fruits, vegetables, and grains.  Taking a fiber supplement.  Taking a live bacteria supplement (probiotic).  Taking medicine to relax your colon. HOME CARE INSTRUCTIONS   Drink at least 6-8 glasses of water each day to prevent constipation.  Try not to strain when you have a bowel movement.  Keep all follow-up appointments. If you have had an infection before:  Increase the fiber in your diet as directed by your health care provider or dietitian.  Take a dietary fiber supplement if your health care provider approves.  Only take medicines as directed by your health care provider. SEEK MEDICAL CARE IF:   You have abdominal pain.  You have bloating.  You have cramps.  You have not gone to the bathroom in 3 days. SEEK IMMEDIATE MEDICAL CARE IF:   Your pain gets worse.  Yourbloating becomes very bad.  You have a fever or chills, and your symptoms suddenly get worse.  You begin vomiting.  You have bowel movements that are bloody or black. MAKE SURE YOU:  Understand these instructions.  Will watch your condition.  Will get help right away if you are not doing well or get worse. Document Released: 06/06/2004 Document Revised: 09/14/2013 Document Reviewed: 08/04/2013 [facility] Patient Information 2015 [place], [place]. This information is not intended to replace advice given to you by your health care provider. Make sure you discuss any questions you have with your health care provider.

## 2015-05-25 NOTE — Anesthesia Preprocedure Evaluation (Signed)
Anesthesia Evaluation  Patient identified by MRN, date of birth, ID band Patient awake    Reviewed: Allergy & Precautions, NPO status , Patient's Chart, lab work & pertinent test results  Airway Mallampati: II  TM Distance: >3 FB     Dental  (+) Partial Upper, Partial Lower   Pulmonary neg pulmonary ROS,  breath sounds clear to auscultation        Cardiovascular hypertension, Pt. on medications Rhythm:Regular Rate:Normal     Neuro/Psych PSYCHIATRIC DISORDERS Depression    GI/Hepatic GERD-  ,  Endo/Other  diabetes, Poorly Controlled, Type 2Hypothyroidism   Renal/GU      Musculoskeletal   Abdominal   Peds  Hematology   Anesthesia Other Findings   Reproductive/Obstetrics                             Anesthesia Physical Anesthesia Plan  ASA: III  Anesthesia Plan: MAC   Post-op Pain Management:    Induction: Intravenous  Airway Management Planned: Simple Face Mask  Additional Equipment:   Intra-op Plan:   Post-operative Plan:   Informed Consent: I have reviewed the patients History and Physical, chart, labs and discussed the procedure including the risks, benefits and alternatives for the proposed anesthesia with the patient or authorized representative who has indicated his/her understanding and acceptance.     Plan Discussed with:   Anesthesia Plan Comments:         Anesthesia Quick Evaluation

## 2015-05-25 NOTE — Op Note (Signed)
[facility] [address] [place], [address]   COLONOSCOPY PROCEDURE REPORT  PATIENT: Kenneth Santiago, Kenneth Santiago  MR#: [id number] BIRTHDATE: 11/26/1940 , 74  yrs. old GENDER: male ENDOSCOPIST: R.  Garfield Cornea, MD FACP [facility] REFERRED UV:OZDGU Quillian Quince, M.D. PROCEDURE DATE:  06-17-15 PROCEDURE:   Ileo-colonoscopy with biopsy INDICATIONS:  chronic diarrhea; Reported history of celiac disease?"but patient says no improvement on a gluten-free diet  MEDICATIONS: Deep sedation per Dr.  [facility] ASA CLASS:       Class III  CONSENT: The risks, benefits, alternatives and imponderables including but not limited to bleeding, perforation as well as the possibility of a missed lesion have been reviewed.  The potential for biopsy, lesion removal, etc. have also been discussed. Questions have been answered.  All parties agreeable.  Please see the history and physical in the medical record for more information.  DESCRIPTION OF PROCEDURE:   After the risks benefits and alternatives of the procedure were thoroughly explained, informed consent was obtained.  The digital rectal exam      The endoscope was introduced through the anus and advanced to the terminal ileum which was intubated for a short distance. No adverse events experienced.   The quality of the prep was adequate  The instrument was then slowly withdrawn as the colon was fully examined. Estimated blood loss is zero unless otherwise noted in this procedure report.      COLON FINDINGS: Single anal papilla; otherwise, normal-appearing rectal mucosa.  Pancolonic diverticulosis; otherwise, the colonic mucosa appeared normal.  The distal 5 cm of terminal ileal mucosa also appeared normal.  Segmentall biopsies of the ascending, descending and sigmoid segments taken for histologic study.  Retroflexion was performed. .   Withdrawal time=8 minutes 0 seconds.  The scope was withdrawn and the procedure  completed. COMPLICATIONS: There were no immediate complications.  ENDOSCOPIC IMPRESSION: Single anal papilla; otherwise, normal-appearing rectal mucosa. Pancolonic diverticulosis; otherwise, the colonic mucosa appeared normal.  The distal 5 cm of terminal ileal mucosa also appeared normal.  Segmentall biopsies of the ascending, descending and sigmoid segments taken for histologic study  RECOMMENDATIONS: Follow up on pathology. Further recommendations to follow.  eSigned:  R. Garfield Cornea, MD Rosalita Chessman [facility] 17-Jun-2015 9:29 AM   cc:  CPT CODES: ICD CODES:  The ICD and CPT codes recommended by this software are interpretations from the data that the clinical staff has captured with the software.  The verification of the translation of this report to the ICD and CPT codes and modifiers is the sole responsibility of the health care institution and practicing physician where this report was generated.  [employer] will not be held responsible for the validity of the ICD and CPT codes included on this report.  AMA assumes no liability for data contained or not contained herein. CPT is a Designer, television/film set of the [employer].  PATIENT NAME:  Kenneth Santiago, Kenneth Santiago MR#: [id number]

## 2015-05-25 NOTE — Interval H&P Note (Signed)
History and Physical Interval Note:  05/25/2015 8:01 AM  Kenneth Santiago  has presented today for surgery, with the diagnosis of diarrhea, change in bowel habits  The various methods of treatment have been discussed with the patient and family. After consideration of risks, benefits and other options for treatment, the patient has consented to  Procedure(s) with comments: COLONOSCOPY WITH PROPOFOL (N/A) - 0900 as a surgical intervention .  The patient's history has been reviewed, patient examined, no change in status, stable for surgery.  I have reviewed the patient's chart and labs.  Questions were answered to the patient's satisfaction.     Kenneth Santiago  No change; unsure how celiac dx made ; no response to a gluten free diet. Dx tcs today per plan.  The risks, benefits, limitations, alternatives and imponderables have been reviewed with the patient. Questions have been answered. All parties are agreeable.

## 2015-05-26 ENCOUNTER — Encounter (HOSPITAL_COMMUNITY): Payer: Self-pay | Admitting: Internal Medicine

## 2015-05-30 ENCOUNTER — Telehealth: Payer: Self-pay

## 2015-05-30 ENCOUNTER — Encounter: Payer: Self-pay | Admitting: Internal Medicine

## 2015-05-30 NOTE — Telephone Encounter (Signed)
APPT MADE AND LETTER SENT  °

## 2015-05-30 NOTE — Telephone Encounter (Signed)
Per RMR-  Send letter to patient.  Send copy of letter with path to referring provider and PCP.    Needs office visit with extender

## 2015-05-30 NOTE — Telephone Encounter (Signed)
Letter mailed to the pt. 

## 2015-06-15 ENCOUNTER — Telehealth: Payer: Self-pay

## 2015-06-15 NOTE — Telephone Encounter (Signed)
Pt is aware.  

## 2015-06-15 NOTE — Telephone Encounter (Signed)
Daughter called worried about her father having the diarrhea. I call him to check on him and he stated that he is feeling better. He does still have the diarrhea some and his breathing is hard at times but nothing out of the ordinary.  I told him that if he need anything to please call us. He said that he would.

## 2015-06-15 NOTE — Telephone Encounter (Signed)
Noted. If his breathing becomes labored he should be seen by either PCP, Urgent Care, or ER.

## 2015-06-20 DIAGNOSIS — E039 Hypothyroidism, unspecified: Secondary | ICD-10-CM | POA: Diagnosis not present

## 2015-07-03 ENCOUNTER — Ambulatory Visit (INDEPENDENT_AMBULATORY_CARE_PROVIDER_SITE_OTHER): Payer: Medicare Other | Admitting: Nurse Practitioner

## 2015-07-03 ENCOUNTER — Encounter: Payer: Self-pay | Admitting: Nurse Practitioner

## 2015-07-03 VITALS — BP 120/63 | HR 52 | Temp 97.0°F | Ht 71.0 in | Wt 199.2 lb

## 2015-07-03 DIAGNOSIS — R197 Diarrhea, unspecified: Secondary | ICD-10-CM | POA: Diagnosis not present

## 2015-07-03 DIAGNOSIS — R194 Change in bowel habit: Secondary | ICD-10-CM

## 2015-07-03 NOTE — Progress Notes (Signed)
Referring Provider: Caryl Bis, MD Primary Care Physician:  Gar Ponto, MD Primary GI:  Dr. Gala Romney  Chief Complaint  Patient presents with  . Follow-up    HPI:   74 year old male presents for follow-up on diarrhea and bowel habit changes. Last seen in our office 05/01/2015 and on that day he stated previously prescribed probiotic was effective for 2 weeks and symptoms returned along with fecal urgency and stool accidents. Was also having abdominal pain mid to lower abdomen and rectum. No other red flag/warning signs or symptoms. Was recently having loose stools 2-5 times a day. Blood work was ordered including stool studies, CBC, tissue transglutaminase IgA, and total IgA. Questionable history of celiac disease as well as referred for colonoscopy. Colonoscopy completed 05/25/2015 which found single anal papilla, otherwise normal appearing rectal mucosa; pancolonic diverticulosis, otherwise normal colonic mucosa. Multiple random biopsies of all segments were taken and found to be unremarkable colon mucosa on pathology. Stool tests and labwork were normal other than grossly elevated CBG which was forward to the patient's PCP.  Today he states his thyroid medication was recently reduced. States his symptoms are mostly resolved. Rarely has softer stools, typically formed and toward the end of his bowel movement will be "a little too softs, but still formed." Is also having bladder incontinence. Symptoms decribed and typical of urge incontinence. Is supposed to see his PCP in a few weeks with bloodwork. Abdominal pain also improved. Denies hematochezia or melena. Denies fever, chills, N/V, unintnentional weight loss. Occasionally his stools start becoming harder, but doesn't every really become constipated. Takes Miralax daily and eats fiber cereal. Denies chest pain, dyspnea, dizziness, lightheadedness, syncope, near syncope. Denies any other upper or lower GI symptoms.  Of note, the patient  is complaining of what sounds to be urge incontinence. I do not feel this is due to colonoscopy or GI issues. He hay be having symptoms of BPH and told patient he should address this with his PCP at his upcoming visit in a couple weeks for possible PSA testing.  Past Medical History  Diagnosis Date  . Diabetes ([place])   . Abdominal pain   . Actinic keratosis   . Basal cell carcinoma   . Celiac disease   . Diverticulosis   . Essential hypertension   . GERD (gastroesophageal reflux disease)   . Hypothyroidism   . Major depression ([place])   . Metabolic syndrome   . Hyperlipidemia   . Osteoarthritis     Past Surgical History  Procedure Laterality Date  . Back surgery  1987  . Eyes surgery    . Cholecystectomy    . Colonoscopy      2007 at [facility]  . Esophagogastroduodenoscopy      2007 at [facility]  . Cataract extraction  1988  . Colonoscopy with propofol N/A 05/25/2015    [id number] anal pipilla otherwise normal/pancolonic diverticulosis  . Esophageal biopsy N/A 05/25/2015    Procedure: BIOPSY;  Surgeon: Daneil Dolin, MD;  Location: [facility];  Service: Endoscopy;  Laterality: N/A;  right and descending/sigmoid colon    Current Outpatient Prescriptions  Medication Sig Dispense Refill  . dorzolamide-timolol (COSOPT) 22.3-6.8 MG/ML ophthalmic solution   0  . latanoprost (XALATAN) 0.005 % ophthalmic solution   0  . losartan (COZAAR) 50 MG tablet 1 Tablet(s) PO daily  0  . sertraline (ZOLOFT) 50 MG tablet take 1 tablet by mouth once daily for depression  0  . SYNTHROID 200 MCG tablet Take 125  mcg by mouth daily before breakfast.   0  . timolol (TIMOPTIC) 0.25 % ophthalmic solution Place 1 drop into both eyes 2 (two) times daily.    Marland Kitchen UNABLE TO FIND RAW PROBIOTICS  - 1 DAILY    . polyethylene glycol-electrolytes (NULYTELY/GOLYTELY) 420 G solution Take 4,000 mLs by mouth once. (Patient not taking: Reported on 07/03/2015) 4000 mL 0   No current facility-administered medications for this  visit.    Allergies as of 07/03/2015 - Review Complete 07/03/2015  Allergen Reaction Noted  . Iodinated diagnostic agents Rash 04/03/2015    Family History  Problem Relation Age of Onset  . CVA Father   . Dementia Mother   . Hyperlipidemia Sister   . Hypothyroidism Sister   . Colon cancer Neg Hx     Social History   Social History  . Marital Status: Widowed    Spouse Name: N/A  . Number of Children: N/A  . Years of Education: N/A   Social History Main Topics  . Smoking status: Never Smoker   . Smokeless tobacco: None  . Alcohol Use: No  . Drug Use: No  . Sexual Activity: Not Asked   Other Topics Concern  . None   Social History Narrative    Review of Systems: General: Negative for anorexia, weight loss, fever, chills, fatigue, weakness. CV: Negative for chest pain, angina, palpitations, peripheral edema.  Respiratory: Negative for dyspnea at rest, cough, sputum, wheezing.  GI: See history of present illness. Endo: Negative for unusual weight change.  Heme: Negative for bruising or bleeding.   Physical Exam: BP 120/63 mmHg  Pulse 52  Temp(Src) 97 F (36.1 C)  Ht 5\' 11"  (1.803 m)  Wt 199 lb 3.2 oz (90.357 kg)  BMI 27.80 kg/m2 General:   Alert and oriented. Pleasant and cooperative. Well-nourished and well-developed.  Head:  Normocephalic and atraumatic. Eyes:  Without icterus, sclera clear and conjunctiva pink.  Ears:  Normal auditory acuity. Cardiovascular:  S1, S2 present without murmurs appreciated. Normal pulses noted. Extremities without clubbing or edema. Respiratory:  Clear to auscultation bilaterally. No wheezes, rales, or rhonchi. No distress.  Gastrointestinal:  +BS, soft, non-tender and non-distended. No HSM noted. No guarding or rebound. No masses appreciated.  Rectal:  Deferred  Neurologic:  Alert and oriented x4;  grossly normal neurologically. Psych:  Alert and cooperative. Normal mood and affect.    07/03/2015 9:13 AM

## 2015-07-03 NOTE — Progress Notes (Signed)
CC'ED TO PCP 

## 2015-07-03 NOTE — Progress Notes (Signed)
CC'D TO PCP °

## 2015-07-03 NOTE — Patient Instructions (Signed)
1. Continue taking MiraLAX once a day. 2. Continue eating fiber cereal. 3. If he noticed her stools are starting to become a little harder, he can take MiraLAX twice a day until this resolves. 4. Return for follow-up as needed for any stomach or colon/bowel problems.

## 2015-07-03 NOTE — Assessment & Plan Note (Signed)
Bowel habit changes resolved, stools are back to being formed, pass easily. No red flag/warning signs or symptoms. Continue current bowel regimen and follow up as needed for any recurrent or changes in symptoms.

## 2015-07-03 NOTE — Assessment & Plan Note (Signed)
Symptoms essentially resolved, stools are formed although potentially a little bit "softer at the end." No further diarrhea. Continue current bowel regimen. Return for follow-up as needed

## 2015-08-04 DIAGNOSIS — E039 Hypothyroidism, unspecified: Secondary | ICD-10-CM | POA: Diagnosis not present

## 2015-08-31 DIAGNOSIS — H401132 Primary open-angle glaucoma, bilateral, moderate stage: Secondary | ICD-10-CM | POA: Diagnosis not present

## 2015-09-19 DIAGNOSIS — E039 Hypothyroidism, unspecified: Secondary | ICD-10-CM | POA: Diagnosis not present

## 2015-10-09 DIAGNOSIS — E782 Mixed hyperlipidemia: Secondary | ICD-10-CM | POA: Diagnosis not present

## 2015-10-09 DIAGNOSIS — E1165 Type 2 diabetes mellitus with hyperglycemia: Secondary | ICD-10-CM | POA: Diagnosis not present

## 2015-10-09 DIAGNOSIS — S7002XA Contusion of left hip, initial encounter: Secondary | ICD-10-CM | POA: Diagnosis not present

## 2015-10-09 DIAGNOSIS — E039 Hypothyroidism, unspecified: Secondary | ICD-10-CM | POA: Diagnosis not present

## 2016-01-13 DIAGNOSIS — E1165 Type 2 diabetes mellitus with hyperglycemia: Secondary | ICD-10-CM | POA: Diagnosis not present

## 2016-01-13 DIAGNOSIS — I10 Essential (primary) hypertension: Secondary | ICD-10-CM | POA: Diagnosis not present

## 2016-01-13 DIAGNOSIS — E782 Mixed hyperlipidemia: Secondary | ICD-10-CM | POA: Diagnosis not present

## 2016-01-13 DIAGNOSIS — E039 Hypothyroidism, unspecified: Secondary | ICD-10-CM | POA: Diagnosis not present

## 2016-01-16 DIAGNOSIS — E782 Mixed hyperlipidemia: Secondary | ICD-10-CM | POA: Diagnosis not present

## 2016-01-16 DIAGNOSIS — E039 Hypothyroidism, unspecified: Secondary | ICD-10-CM | POA: Diagnosis not present

## 2016-01-16 DIAGNOSIS — E1165 Type 2 diabetes mellitus with hyperglycemia: Secondary | ICD-10-CM | POA: Diagnosis not present

## 2016-01-25 DIAGNOSIS — E1140 Type 2 diabetes mellitus with diabetic neuropathy, unspecified: Secondary | ICD-10-CM | POA: Diagnosis not present

## 2016-01-25 DIAGNOSIS — L609 Nail disorder, unspecified: Secondary | ICD-10-CM | POA: Diagnosis not present

## 2016-01-25 DIAGNOSIS — M79672 Pain in left foot: Secondary | ICD-10-CM | POA: Diagnosis not present

## 2016-01-25 DIAGNOSIS — M79671 Pain in right foot: Secondary | ICD-10-CM | POA: Diagnosis not present

## 2016-03-07 DIAGNOSIS — E782 Mixed hyperlipidemia: Secondary | ICD-10-CM | POA: Diagnosis not present

## 2016-03-07 DIAGNOSIS — I10 Essential (primary) hypertension: Secondary | ICD-10-CM | POA: Diagnosis not present

## 2016-03-07 DIAGNOSIS — E1165 Type 2 diabetes mellitus with hyperglycemia: Secondary | ICD-10-CM | POA: Diagnosis not present

## 2016-03-07 DIAGNOSIS — K219 Gastro-esophageal reflux disease without esophagitis: Secondary | ICD-10-CM | POA: Diagnosis not present

## 2016-03-07 DIAGNOSIS — E039 Hypothyroidism, unspecified: Secondary | ICD-10-CM | POA: Diagnosis not present

## 2016-03-12 DIAGNOSIS — H401133 Primary open-angle glaucoma, bilateral, severe stage: Secondary | ICD-10-CM | POA: Diagnosis not present

## 2016-03-20 DIAGNOSIS — H401133 Primary open-angle glaucoma, bilateral, severe stage: Secondary | ICD-10-CM | POA: Diagnosis not present

## 2016-04-09 DIAGNOSIS — H401133 Primary open-angle glaucoma, bilateral, severe stage: Secondary | ICD-10-CM | POA: Diagnosis not present

## 2016-08-20 ENCOUNTER — Ambulatory Visit: Payer: Medicare Other | Admitting: Internal Medicine

## 2016-08-21 ENCOUNTER — Emergency Department (HOSPITAL_COMMUNITY): Payer: Medicare Other

## 2016-08-21 ENCOUNTER — Observation Stay (HOSPITAL_COMMUNITY)
Admission: EM | Admit: 2016-08-21 | Discharge: 2016-08-23 | Disposition: A | Discharge status: Home / Self Care | Payer: Medicare Other | Attending: Internal Medicine | Admitting: Internal Medicine

## 2016-08-21 ENCOUNTER — Encounter (HOSPITAL_COMMUNITY): Payer: Self-pay | Admitting: *Deleted

## 2016-08-21 DIAGNOSIS — K900 Celiac disease: Secondary | ICD-10-CM | POA: Diagnosis not present

## 2016-08-21 DIAGNOSIS — I10 Essential (primary) hypertension: Secondary | ICD-10-CM | POA: Diagnosis not present

## 2016-08-21 DIAGNOSIS — R109 Unspecified abdominal pain: Secondary | ICD-10-CM

## 2016-08-21 DIAGNOSIS — N400 Enlarged prostate without lower urinary tract symptoms: Secondary | ICD-10-CM | POA: Diagnosis not present

## 2016-08-21 DIAGNOSIS — I2510 Atherosclerotic heart disease of native coronary artery without angina pectoris: Secondary | ICD-10-CM | POA: Insufficient documentation

## 2016-08-21 DIAGNOSIS — I4581 Long QT syndrome: Secondary | ICD-10-CM | POA: Diagnosis not present

## 2016-08-21 DIAGNOSIS — M1990 Unspecified osteoarthritis, unspecified site: Secondary | ICD-10-CM | POA: Insufficient documentation

## 2016-08-21 DIAGNOSIS — K2950 Unspecified chronic gastritis without bleeding: Secondary | ICD-10-CM | POA: Insufficient documentation

## 2016-08-21 DIAGNOSIS — H401133 Primary open-angle glaucoma, bilateral, severe stage: Secondary | ICD-10-CM | POA: Diagnosis not present

## 2016-08-21 DIAGNOSIS — R1013 Epigastric pain: Secondary | ICD-10-CM | POA: Diagnosis present

## 2016-08-21 DIAGNOSIS — K529 Noninfective gastroenteritis and colitis, unspecified: Secondary | ICD-10-CM | POA: Insufficient documentation

## 2016-08-21 DIAGNOSIS — K922 Gastrointestinal hemorrhage, unspecified: Principal | ICD-10-CM

## 2016-08-21 DIAGNOSIS — F329 Major depressive disorder, single episode, unspecified: Secondary | ICD-10-CM | POA: Diagnosis not present

## 2016-08-21 DIAGNOSIS — Z9049 Acquired absence of other specified parts of digestive tract: Secondary | ICD-10-CM | POA: Diagnosis not present

## 2016-08-21 DIAGNOSIS — Z79899 Other long term (current) drug therapy: Secondary | ICD-10-CM | POA: Insufficient documentation

## 2016-08-21 DIAGNOSIS — K269 Duodenal ulcer, unspecified as acute or chronic, without hemorrhage or perforation: Secondary | ICD-10-CM | POA: Diagnosis not present

## 2016-08-21 DIAGNOSIS — Z823 Family history of stroke: Secondary | ICD-10-CM | POA: Insufficient documentation

## 2016-08-21 DIAGNOSIS — L570 Actinic keratosis: Secondary | ICD-10-CM | POA: Diagnosis not present

## 2016-08-21 DIAGNOSIS — K5730 Diverticulosis of large intestine without perforation or abscess without bleeding: Secondary | ICD-10-CM | POA: Insufficient documentation

## 2016-08-21 DIAGNOSIS — R9389 Abnormal findings on diagnostic imaging of other specified body structures: Secondary | ICD-10-CM

## 2016-08-21 DIAGNOSIS — K3189 Other diseases of stomach and duodenum: Secondary | ICD-10-CM | POA: Diagnosis not present

## 2016-08-21 DIAGNOSIS — E785 Hyperlipidemia, unspecified: Secondary | ICD-10-CM | POA: Insufficient documentation

## 2016-08-21 DIAGNOSIS — R933 Abnormal findings on diagnostic imaging of other parts of digestive tract: Secondary | ICD-10-CM | POA: Diagnosis not present

## 2016-08-21 DIAGNOSIS — K449 Diaphragmatic hernia without obstruction or gangrene: Secondary | ICD-10-CM | POA: Insufficient documentation

## 2016-08-21 DIAGNOSIS — Z85828 Personal history of other malignant neoplasm of skin: Secondary | ICD-10-CM | POA: Diagnosis not present

## 2016-08-21 DIAGNOSIS — Q438 Other specified congenital malformations of intestine: Secondary | ICD-10-CM

## 2016-08-21 DIAGNOSIS — E8881 Metabolic syndrome: Secondary | ICD-10-CM | POA: Insufficient documentation

## 2016-08-21 DIAGNOSIS — I700 Atherosclerosis of aorta: Secondary | ICD-10-CM | POA: Diagnosis not present

## 2016-08-21 DIAGNOSIS — K219 Gastro-esophageal reflux disease without esophagitis: Secondary | ICD-10-CM | POA: Insufficient documentation

## 2016-08-21 DIAGNOSIS — Z794 Long term (current) use of insulin: Secondary | ICD-10-CM | POA: Insufficient documentation

## 2016-08-21 DIAGNOSIS — E119 Type 2 diabetes mellitus without complications: Secondary | ICD-10-CM | POA: Diagnosis not present

## 2016-08-21 DIAGNOSIS — Z9849 Cataract extraction status, unspecified eye: Secondary | ICD-10-CM | POA: Diagnosis not present

## 2016-08-21 DIAGNOSIS — Z8249 Family history of ischemic heart disease and other diseases of the circulatory system: Secondary | ICD-10-CM | POA: Insufficient documentation

## 2016-08-21 DIAGNOSIS — Z8349 Family history of other endocrine, nutritional and metabolic diseases: Secondary | ICD-10-CM | POA: Insufficient documentation

## 2016-08-21 DIAGNOSIS — K921 Melena: Secondary | ICD-10-CM | POA: Diagnosis present

## 2016-08-21 DIAGNOSIS — E039 Hypothyroidism, unspecified: Secondary | ICD-10-CM | POA: Insufficient documentation

## 2016-08-21 DIAGNOSIS — Z91041 Radiographic dye allergy status: Secondary | ICD-10-CM | POA: Insufficient documentation

## 2016-08-21 DIAGNOSIS — J439 Emphysema, unspecified: Secondary | ICD-10-CM | POA: Diagnosis not present

## 2016-08-21 LAB — URINALYSIS, ROUTINE W REFLEX MICROSCOPIC
BILIRUBIN URINE: NEGATIVE
HGB URINE DIPSTICK: NEGATIVE
Ketones, ur: NEGATIVE mg/dL
Leukocytes, UA: NEGATIVE
Nitrite: NEGATIVE
PROTEIN: NEGATIVE mg/dL
Specific Gravity, Urine: 1.02 (ref 1.005–1.030)
pH: 5.5 (ref 5.0–8.0)

## 2016-08-21 LAB — COMPREHENSIVE METABOLIC PANEL
ALK PHOS: 78 U/L (ref 38–126)
ALT: 23 U/L (ref 17–63)
AST: 22 U/L (ref 15–41)
Albumin: 3.7 g/dL (ref 3.5–5.0)
Anion gap: 7 (ref 5–15)
BILIRUBIN TOTAL: 0.6 mg/dL (ref 0.3–1.2)
BUN: 13 mg/dL (ref 6–20)
CALCIUM: 9.1 mg/dL (ref 8.9–10.3)
CO2: 26 mmol/L (ref 22–32)
CREATININE: 0.85 mg/dL (ref 0.61–1.24)
Chloride: 99 mmol/L — ABNORMAL LOW (ref 101–111)
GFR calc Af Amer: >60 mL/min (ref >60)
Glucose, Bld: 368 mg/dL — ABNORMAL HIGH (ref 65–99)
Potassium: 3.6 mmol/L (ref 3.5–5.1)
Sodium: 132 mmol/L — ABNORMAL LOW (ref 135–145)
TOTAL PROTEIN: 7.1 g/dL (ref 6.5–8.1)

## 2016-08-21 LAB — LACTIC ACID, PLASMA
LACTIC ACID, VENOUS: 1.6 mmol/L (ref 0.5–1.9)
Lactic Acid, Venous: 2.2 mmol/L (ref 0.5–1.9)

## 2016-08-21 LAB — TYPE AND SCREEN
ABO/RH(D): O POS
ANTIBODY SCREEN: NEGATIVE

## 2016-08-21 LAB — CBC
HCT: 42.2 % (ref 39.0–52.0)
Hemoglobin: 14.5 g/dL (ref 13.0–17.0)
MCH: 30.3 pg (ref 26.0–34.0)
MCHC: 34.4 g/dL (ref 30.0–36.0)
MCV: 88.1 fL (ref 78.0–100.0)
PLATELETS: 188 10*3/uL (ref 150–400)
RBC: 4.79 MIL/uL (ref 4.22–5.81)
RDW: 13.4 % (ref 11.5–15.5)
WBC: 6.9 10*3/uL (ref 4.0–10.5)

## 2016-08-21 LAB — URINE MICROSCOPIC-ADD ON: RBC / HPF: NONE SEEN RBC/hpf (ref 0–5)

## 2016-08-21 LAB — TROPONIN I: TROPONIN I: 0.03 ng/mL — AB (ref <0.03)

## 2016-08-21 LAB — LIPASE, BLOOD: LIPASE: 37 U/L (ref 11–51)

## 2016-08-21 LAB — POC OCCULT BLOOD, ED: Fecal Occult Bld: POSITIVE — AB

## 2016-08-21 MED ORDER — SODIUM CHLORIDE 0.9 % IV SOLN
INTRAVENOUS | Status: DC
  Administered 2016-08-21: 22:00:00 via INTRAVENOUS

## 2016-08-21 MED ORDER — IOPAMIDOL (ISOVUE-300) INJECTION 61%
INTRAVENOUS | Status: AC
  Filled 2016-08-21: qty 30

## 2016-08-21 MED ORDER — SODIUM CHLORIDE 0.9 % IV BOLUS (SEPSIS)
1000.0000 mL | Freq: Once | INTRAVENOUS | Status: AC
  Administered 2016-08-21: 1000 mL via INTRAVENOUS

## 2016-08-21 NOTE — ED Triage Notes (Signed)
Pt reports LUQ pain that started a few days and black, sticky rectal bleeding that started this morning. Pt reports weakness, SOB, and that his feet are feeling numb.

## 2016-08-21 NOTE — ED Notes (Signed)
Pt pt on monitor per [facility]

## 2016-08-21 NOTE — ED Notes (Signed)
CRITICAL VALUE ALERT  Critical value received:  Lactic Acid 2.2  Date of notification:  08/21/16  Time of notification:  2026 hrs  Critical value read back:Yes.    Nurse who received alert:  Y. Wava Kildow, RN  Responding MD:  Dr. Thurnell Garbe  Time MD responded:  2027 hrs

## 2016-08-21 NOTE — ED Notes (Signed)
Went to do vitals lab in with pt

## 2016-08-21 NOTE — ED Notes (Signed)
Went to update vitals in 5 pt in radiology

## 2016-08-21 NOTE — ED Provider Notes (Signed)
[place] DEPT Provider Note   CSN: [id number] Arrival date & time: 08/21/16  1711     History   Chief Complaint Chief Complaint  Patient presents with  . Rectal Bleeding  . Abdominal Pain    HPI Kenneth Santiago is a 75 y.o. male.  HPI  Pt was seen at [place].  Per pt, c/o gradual onset and persistence of constant generalized abd "pain" since yesterday.  Has been associated with multiple intermittent episodes of "dark sticky stools."  Describes the abd pain as "stabbing."  Denies vomiting/diarrhea, no fevers, no back pain, no rash, no CP/SOB, no blood in stools.      Past Medical History:  Diagnosis Date  . Abdominal pain   . Actinic keratosis   . Basal cell carcinoma   . Celiac disease   . Diabetes ([place])   . Diverticulosis   . Essential hypertension   . GERD (gastroesophageal reflux disease)   . Hyperlipidemia   . Hypothyroidism   . Major depression   . Metabolic syndrome   . Osteoarthritis     Patient Active Problem List   Diagnosis Date Noted  . Chronic diarrhea   . Diverticulosis of colon without hemorrhage   . Diarrhea 04/03/2015  . Bowel habit changes 04/03/2015    Past Surgical History:  Procedure Laterality Date  . BACK SURGERY  1987  . BIOPSY N/A 05/25/2015   Procedure: BIOPSY;  Surgeon: Daneil Dolin, MD;  Location: [facility];  Service: Endoscopy;  Laterality: N/A;  right and descending/sigmoid colon  . CATARACT EXTRACTION  1988  . CHOLECYSTECTOMY    . COLONOSCOPY     2007 at [facility]  . COLONOSCOPY WITH PROPOFOL N/A 05/25/2015   [id number] anal pipilla otherwise normal/pancolonic diverticulosis  . ESOPHAGOGASTRODUODENOSCOPY     2007 at [facility]  . EYES SURGERY         Home Medications    Prior to Admission medications   Medication Sig Start Date End Date Taking? Authorizing Provider  dorzolamide-timolol (COSOPT) 22.3-6.8 MG/ML ophthalmic solution  01/16/15   Historical Provider, MD  latanoprost (XALATAN) 0.005 % ophthalmic solution   04/05/15   Historical Provider, MD  levothyroxine (SYNTHROID, LEVOTHROID) 100 MCG tablet Take 100 mcg by mouth daily before breakfast.    Historical Provider, MD  losartan (COZAAR) 50 MG tablet 1 Tablet(s) PO daily 01/17/15   Historical Provider, MD  polyethylene glycol-electrolytes (NULYTELY/GOLYTELY) 420 G solution Take 4,000 mLs by mouth once. Patient not taking: Reported on 07/03/2015 05/01/15   Carlis Stable, NP  sertraline (ZOLOFT) 50 MG tablet take 1 tablet by mouth once daily for depression 01/17/15   Historical Provider, MD  timolol (TIMOPTIC) 0.25 % ophthalmic solution Place 1 drop into both eyes 2 (two) times daily.    Historical Provider, MD  UNABLE TO FIND RAW PROBIOTICS  - 1 DAILY    Historical Provider, MD    Family History Family History  Problem Relation Age of Onset  . CVA Father   . Dementia Mother   . Hyperlipidemia Sister   . Hypothyroidism Sister   . Colon cancer Neg Hx     Social History Social History  Substance Use Topics  . Smoking status: Never Smoker  . Smokeless tobacco: Never Used  . Alcohol use No     Allergies   Iodinated diagnostic agents   Review of Systems Review of Systems ROS: Statement: All systems negative except as marked or noted in the HPI; Constitutional: Negative for fever and chills. ; ;  Eyes: Negative for eye pain, redness and discharge. ; ; ENMT: Negative for ear pain, hoarseness, nasal congestion, sinus pressure and sore throat. ; ; Cardiovascular: Negative for chest pain, palpitations, diaphoresis, dyspnea and peripheral edema. ; ; Respiratory: Negative for cough, wheezing and stridor. ; ; Gastrointestinal: +abd pain, "dark stools." Negative for nausea, vomiting, diarrhea, blood in stool, hematemesis, jaundice and rectal bleeding. . ; ; Genitourinary: Negative for dysuria, flank pain and hematuria. ; ; Musculoskeletal: Negative for back pain and neck pain. Negative for swelling and trauma.; ; Skin: Negative for pruritus, rash, abrasions,  blisters, bruising and skin lesion.; ; Neuro: Negative for headache, lightheadedness and neck stiffness. Negative for weakness, altered level of consciousness, altered mental status, extremity weakness, paresthesias, involuntary movement, seizure and syncope.       Physical Exam Updated Vital Signs BP (!) 168/53 (BP Location: Left Arm)   Pulse 74   Temp 98 F (36.7 C) (Oral)   Resp 20   Ht 5\' 11"  (1.803 m)   Wt 180 lb (81.6 kg)   SpO2 100%   BMI 25.10 kg/m    20:10 Orthostatic Vital Signs TW  Orthostatic Lying   BP- Lying: 123/61  Pulse- Lying: 67      Orthostatic Sitting  BP- Sitting: 114/74  Pulse- Sitting: 89      Orthostatic Standing at 0 minutes  BP- Standing at 0 minutes: 118/71    Physical Exam 1945: Physical examination:  Nursing notes reviewed; Vital signs and O2 SAT reviewed;  Constitutional: Well developed, Well nourished, Well hydrated, In no acute distress; Head:  Normocephalic, atraumatic; Eyes: EOMI, PERRL, No scleral icterus; ENMT: Mouth and pharynx normal, Mucous membranes moist; Neck: Supple, Full range of motion, No lymphadenopathy; Cardiovascular: Regular rate and rhythm, No gallop; Respiratory: Breath sounds clear & equal bilaterally, No wheezes.  Speaking full sentences with ease, Normal respiratory effort/excursion; Chest: Nontender, Movement normal; Abdomen: Soft, +diffuse tenderness to palp, esp LLQ. Nondistended, Normal bowel sounds. Rectal exam performed w/permission of pt and ED RN chaperone present.  Anal tone normal.  Non-tender, black stool in rectal vault, faintly heme positive.  No fissures, no external hemorrhoids, no palp masses.;;; Genitourinary: No CVA tenderness; Extremities: Pulses normal, No tenderness, No edema, No calf edema or asymmetry.; Neuro: AA&Ox3, Major CN grossly intact.  Speech clear. No gross focal motor or sensory deficits in extremities.; Skin: Color normal, Warm, Dry.   ED Treatments / Results  Labs (all labs ordered are  listed, but only abnormal results are displayed)   EKG  EKG Interpretation  Date/Time:  Wednesday August 21 2016 20:06:57 EST Ventricular Rate:  72 PR Interval:  192 QRS Duration: 99 QT Interval:  406 QTC Calculation: 445 R Axis:   -25 Text Interpretation:  Sinus rhythm Borderline left axis deviation RSR' in V1 or V2, right VCD or RVH Since last tracing of earlier today QT has shortened Otherwise no significant change Confirmed by [facility]  MD, Nunzio Cory [phone]) on 08/21/2016 8:27:24 PM       Radiology   Procedures Procedures (including critical care time)  Medications Ordered in ED Medications  sodium chloride 0.9 % bolus 1,000 mL (not administered)     Initial Impression / Assessment and Plan / ED Course  I have reviewed the triage vital signs and the nursing notes.  Pertinent labs & imaging results that were available during my care of the patient were reviewed by me and considered in my medical decision making (see chart for details).  MDM Reviewed: previous  chart, nursing note and vitals Reviewed previous: labs and ECG Interpretation: labs, ECG, CT scan and x-ray   Results for orders placed or performed during the hospital encounter of 08/21/16  Comprehensive metabolic panel  Result Value Ref Range   Sodium 132 (L) 135 - 145 mmol/L   Potassium 3.6 3.5 - 5.1 mmol/L   Chloride 99 (L) 101 - 111 mmol/L   CO2 26 22 - 32 mmol/L   Glucose, Bld 368 (H) 65 - 99 mg/dL   BUN 13 6 - 20 mg/dL   Creatinine, Ser 0.85 0.61 - 1.24 mg/dL   Calcium 9.1 8.9 - 10.3 mg/dL   Total Protein 7.1 6.5 - 8.1 g/dL   Albumin 3.7 3.5 - 5.0 g/dL   AST 22 15 - 41 U/L   ALT 23 17 - 63 U/L   Alkaline Phosphatase 78 38 - 126 U/L   Total Bilirubin 0.6 0.3 - 1.2 mg/dL   GFR calc non Af Amer >60 >60 mL/min   GFR calc Af Amer >60 >60 mL/min   Anion gap 7 5 - 15  CBC  Result Value Ref Range   WBC 6.9 4.0 - 10.5 K/uL   RBC 4.79 4.22 - 5.81 MIL/uL   Hemoglobin 14.5 13.0 - 17.0 g/dL   HCT  42.2 39.0 - 52.0 %   MCV 88.1 78.0 - 100.0 fL   MCH 30.3 26.0 - 34.0 pg   MCHC 34.4 30.0 - 36.0 g/dL   RDW 13.4 11.5 - 15.5 %   Platelets 188 150 - 400 K/uL  Troponin I  Result Value Ref Range   Troponin I 0.03 (HH) <0.03 ng/mL  Lactic acid, plasma  Result Value Ref Range   Lactic Acid, Venous 2.2 (HH) 0.5 - 1.9 mmol/L  Lipase, blood  Result Value Ref Range   Lipase 37 11 - 51 U/L  POC occult blood, ED  Result Value Ref Range   Fecal Occult Bld POSITIVE (A) NEGATIVE  Type and screen [facility]  Result Value Ref Range   ABO/RH(D) O POS    Antibody Screen NEG    Sample Expiration 08/24/2016    Dg Chest 2 View Result Date: 08/21/2016 CLINICAL DATA:  Left upper quadrant pain, weakness and dyspnea. EXAM: CHEST  2 VIEW COMPARISON:  Report from 01/21/2014 FINDINGS: The heart is top-normal in size. The thoracic aorta is slightly tortuous without definite aneurysm. And mild hyperinflation of the lungs without pneumonic consolidation, CHF nor effusion. There is osteoarthritic spurring about the AC joints. Small anterior osteophytes are seen along the dorsal spine. No acute osseous abnormality. IMPRESSION: Mild emphysematous hyperinflation of the lungs without acute pulmonary disease. Electronically Signed   By: Ashley Royalty M.D.   On: 08/21/2016 21:34    2210:  IVF given for mildly elevated lactic acid. Troponin mildly elevated, no old to compare; pt denies CP.  No stooling while in the ED. CT A/P pending. Sign out to Dr. Wilson Singer.    Final Clinical Impressions(s) / ED Diagnoses   Final diagnoses:  None    New Prescriptions New Prescriptions   No medications on file     Francine Graven, DO 08/21/16 2214

## 2016-08-21 NOTE — ED Notes (Signed)
CRITICAL VALUE ALERT  Critical value received:  Troponin 0.03  Date of notification:  08/21/2016  Time of notification:  2033  Critical value read back:yes  Nurse who received alert:  Tilden Fossa  MD notified (1st page):  Thurnell Garbe  Time of first page:  2033  MD notified (2nd page):  Time of second page:  Responding MD:  Thurnell Garbe  Time MD responded:  2033

## 2016-08-22 ENCOUNTER — Encounter (HOSPITAL_COMMUNITY)
Admission: EM | Disposition: A | Discharge status: Home / Self Care | Payer: Self-pay | Source: Home / Self Care | Attending: Emergency Medicine

## 2016-08-22 ENCOUNTER — Encounter (HOSPITAL_COMMUNITY): Payer: Self-pay | Admitting: Family Medicine

## 2016-08-22 ENCOUNTER — Observation Stay (HOSPITAL_COMMUNITY): Payer: Medicare Other | Admitting: Anesthesiology

## 2016-08-22 DIAGNOSIS — K2950 Unspecified chronic gastritis without bleeding: Secondary | ICD-10-CM | POA: Diagnosis not present

## 2016-08-22 DIAGNOSIS — Q438 Other specified congenital malformations of intestine: Secondary | ICD-10-CM | POA: Diagnosis not present

## 2016-08-22 DIAGNOSIS — E8881 Metabolic syndrome: Secondary | ICD-10-CM | POA: Diagnosis present

## 2016-08-22 DIAGNOSIS — K921 Melena: Secondary | ICD-10-CM | POA: Diagnosis not present

## 2016-08-22 DIAGNOSIS — I10 Essential (primary) hypertension: Secondary | ICD-10-CM | POA: Diagnosis present

## 2016-08-22 DIAGNOSIS — R933 Abnormal findings on diagnostic imaging of other parts of digestive tract: Secondary | ICD-10-CM

## 2016-08-22 DIAGNOSIS — R9389 Abnormal findings on diagnostic imaging of other specified body structures: Secondary | ICD-10-CM

## 2016-08-22 DIAGNOSIS — K900 Celiac disease: Secondary | ICD-10-CM | POA: Diagnosis not present

## 2016-08-22 DIAGNOSIS — K269 Duodenal ulcer, unspecified as acute or chronic, without hemorrhage or perforation: Secondary | ICD-10-CM

## 2016-08-22 DIAGNOSIS — K922 Gastrointestinal hemorrhage, unspecified: Secondary | ICD-10-CM | POA: Diagnosis not present

## 2016-08-22 DIAGNOSIS — K449 Diaphragmatic hernia without obstruction or gangrene: Secondary | ICD-10-CM | POA: Diagnosis not present

## 2016-08-22 DIAGNOSIS — R109 Unspecified abdominal pain: Secondary | ICD-10-CM | POA: Insufficient documentation

## 2016-08-22 DIAGNOSIS — R1013 Epigastric pain: Secondary | ICD-10-CM | POA: Diagnosis present

## 2016-08-22 DIAGNOSIS — K2951 Unspecified chronic gastritis with bleeding: Secondary | ICD-10-CM | POA: Diagnosis not present

## 2016-08-22 HISTORY — PX: BIOPSY: SHX5522

## 2016-08-22 HISTORY — PX: ESOPHAGOGASTRODUODENOSCOPY (EGD) WITH PROPOFOL: SHX5813

## 2016-08-22 LAB — GLUCOSE, CAPILLARY
GLUCOSE-CAPILLARY: 158 mg/dL — AB (ref 65–99)
GLUCOSE-CAPILLARY: 143 mg/dL — AB (ref 65–99)
GLUCOSE-CAPILLARY: 180 mg/dL — AB (ref 65–99)
GLUCOSE-CAPILLARY: 256 mg/dL — AB (ref 65–99)
Glucose-Capillary: 189 mg/dL — ABNORMAL HIGH (ref 65–99)
Glucose-Capillary: 179 mg/dL — ABNORMAL HIGH (ref 65–99)
Glucose-Capillary: 185 mg/dL — ABNORMAL HIGH (ref 65–99)

## 2016-08-22 LAB — BASIC METABOLIC PANEL
ANION GAP: 6 (ref 5–15)
BUN: 8 mg/dL (ref 6–20)
CHLORIDE: 106 mmol/L (ref 101–111)
CO2: 24 mmol/L (ref 22–32)
Calcium: 8.1 mg/dL — ABNORMAL LOW (ref 8.9–10.3)
Creatinine, Ser: 0.7 mg/dL (ref 0.61–1.24)
GFR calc Af Amer: >60 mL/min (ref >60)
GLUCOSE: 184 mg/dL — AB (ref 65–99)
POTASSIUM: 3.1 mmol/L — AB (ref 3.5–5.1)
Sodium: 136 mmol/L (ref 135–145)

## 2016-08-22 LAB — CBC
HEMATOCRIT: 36.7 % — AB (ref 39.0–52.0)
HEMOGLOBIN: 12.6 g/dL — AB (ref 13.0–17.0)
MCH: 30.2 pg (ref 26.0–34.0)
MCHC: 34.3 g/dL (ref 30.0–36.0)
MCV: 88 fL (ref 78.0–100.0)
Platelets: 133 10*3/uL — ABNORMAL LOW (ref 150–400)
RBC: 4.17 MIL/uL — ABNORMAL LOW (ref 4.22–5.81)
RDW: 13.4 % (ref 11.5–15.5)
WBC: 6.6 10*3/uL (ref 4.0–10.5)

## 2016-08-22 SURGERY — ESOPHAGOGASTRODUODENOSCOPY (EGD) WITH PROPOFOL
Anesthesia: Monitor Anesthesia Care

## 2016-08-22 MED ORDER — LACTATED RINGERS IV SOLN: INTRAVENOUS | Status: DC

## 2016-08-22 MED ORDER — LIDOCAINE VISCOUS 2 % MT SOLN
OROMUCOSAL | Status: AC
  Filled 2016-08-22: qty 15

## 2016-08-22 MED ORDER — SODIUM CHLORIDE 0.9% FLUSH
3.0000 mL | Freq: Two times a day (BID) | INTRAVENOUS | Status: DC
  Administered 2016-08-22 – 2016-08-23 (×3): 3 mL via INTRAVENOUS

## 2016-08-22 MED ORDER — ONDANSETRON HCL 4 MG PO TABS: 4.0000 mg | ORAL_TABLET | Freq: Four times a day (QID) | ORAL | Status: DC | PRN

## 2016-08-22 MED ORDER — SODIUM CHLORIDE 0.9% FLUSH
3.0000 mL | INTRAVENOUS | Status: DC | PRN
  Administered 2016-08-22: 3 mL via INTRAVENOUS
  Filled 2016-08-22: qty 3

## 2016-08-22 MED ORDER — MIDAZOLAM HCL 2 MG/2ML IJ SOLN
1.0000 mg | INTRAMUSCULAR | Status: DC | PRN
Start: 2016-08-22 — End: 2016-08-22
  Administered 2016-08-22: 2 mg via INTRAVENOUS

## 2016-08-22 MED ORDER — LIDOCAINE VISCOUS 2 % MT SOLN
15.0000 mL | Freq: Once | OROMUCOSAL | Status: AC
  Administered 2016-08-22: 15 mL via OROMUCOSAL

## 2016-08-22 MED ORDER — PROPOFOL 10 MG/ML IV BOLUS
INTRAVENOUS | Status: AC
  Filled 2016-08-22: qty 40

## 2016-08-22 MED ORDER — POTASSIUM CHLORIDE 10 MEQ/100ML IV SOLN
10.0000 meq | INTRAVENOUS | Status: AC
  Administered 2016-08-22 (×2): 10 meq via INTRAVENOUS
  Filled 2016-08-22 (×2): qty 100

## 2016-08-22 MED ORDER — INSULIN ASPART 100 UNIT/ML ~~LOC~~ SOLN
0.0000 [IU] | SUBCUTANEOUS | Status: DC
  Administered 2016-08-22: 2 [IU] via SUBCUTANEOUS
  Administered 2016-08-22: 5 [IU] via SUBCUTANEOUS
  Administered 2016-08-23: 3 [IU] via SUBCUTANEOUS

## 2016-08-22 MED ORDER — FENTANYL CITRATE (PF) 100 MCG/2ML IJ SOLN
25.0000 ug | INTRAMUSCULAR | Status: AC | PRN
  Administered 2016-08-22 (×2): 25 ug via INTRAVENOUS

## 2016-08-22 MED ORDER — GI COCKTAIL ~~LOC~~
30.0000 mL | Freq: Three times a day (TID) | ORAL | Status: DC | PRN
  Administered 2016-08-22: 30 mL via ORAL
  Filled 2016-08-22: qty 30

## 2016-08-22 MED ORDER — SODIUM CHLORIDE 0.9 % IV SOLN
INTRAVENOUS | Status: DC
  Administered 2016-08-22: 11:00:00 via INTRAVENOUS

## 2016-08-22 MED ORDER — PROPOFOL 500 MG/50ML IV EMUL
INTRAVENOUS | Status: DC | PRN
  Administered 2016-08-22: 140 ug/kg/min via INTRAVENOUS

## 2016-08-22 MED ORDER — PANTOPRAZOLE SODIUM 40 MG IV SOLR
40.0000 mg | Freq: Two times a day (BID) | INTRAVENOUS | Status: DC
  Administered 2016-08-22 – 2016-08-23 (×3): 40 mg via INTRAVENOUS
  Filled 2016-08-22 (×3): qty 40

## 2016-08-22 MED ORDER — HYDROMORPHONE HCL 1 MG/ML IJ SOLN
0.5000 mg | INTRAMUSCULAR | Status: DC | PRN
  Filled 2016-08-22: qty 0.5

## 2016-08-22 MED ORDER — FENTANYL CITRATE (PF) 100 MCG/2ML IJ SOLN
INTRAMUSCULAR | Status: AC
  Filled 2016-08-22: qty 2

## 2016-08-22 MED ORDER — ONDANSETRON HCL 4 MG/2ML IJ SOLN: 4.0000 mg | Freq: Four times a day (QID) | INTRAMUSCULAR | Status: DC | PRN

## 2016-08-22 MED ORDER — SODIUM CHLORIDE 0.9 % IV SOLN
250.0000 mL | INTRAVENOUS | Status: DC | PRN
  Administered 2016-08-22: 14:00:00 via INTRAVENOUS

## 2016-08-22 MED ORDER — PANTOPRAZOLE SODIUM 40 MG IV SOLR
80.0000 mg | Freq: Once | INTRAVENOUS | Status: AC
  Administered 2016-08-22: 80 mg via INTRAVENOUS
  Filled 2016-08-22: qty 80

## 2016-08-22 MED ORDER — LIDOCAINE HCL (PF) 1 % IJ SOLN
INTRAMUSCULAR | Status: AC
  Filled 2016-08-22: qty 5

## 2016-08-22 MED ORDER — MIDAZOLAM HCL 2 MG/2ML IJ SOLN
INTRAMUSCULAR | Status: AC
  Filled 2016-08-22: qty 2

## 2016-08-22 NOTE — Care Management Note (Signed)
Case Management Note  Patient Details  Name: Kenneth Santiago MRN: [id number] Date of Birth: Nov 22, 1940  Subjective/Objective:   Patient adm from home UGIB. Lives alone, ind with ADL's. Still drives to PCP appointments. Reports no issues affording medications.                  Action/Plan: Anticipate DC home with self care. No CM needs identified.   Expected Discharge Date:              08/22/2016    Expected Discharge Plan:  Home/Self Care  In-House Referral:  NA  Discharge planning Services  CM Consult  Post Acute Care Choice:  NA Choice offered to:  NA  DME Arranged:    DME Agency:     HH Arranged:    HH Agency:     Status of Service:  Completed, signed off  If discussed at [employer] of Stay Meetings, dates discussed:    Additional Comments:  Deina Lipsey, Chauncey Reading, RN 08/22/2016, 2:52 PM

## 2016-08-22 NOTE — ED Provider Notes (Signed)
I assumed care with imaging pending. He probably has a bleeding duodenal ulcer. Still having melena in ED. Protonix ordered. HD stable. No blood thinners.  Has previously seen GI but over 10 years ago in [facility] and cannot remember the name. Will discuss with medicine for admission.    Virgel Manifold, MD 08/22/16 [phone]

## 2016-08-22 NOTE — Anesthesia Postprocedure Evaluation (Signed)
Anesthesia Post Note  Patient: Kenneth Santiago  Procedure(s) Performed: Procedure(s) (LRB): ESOPHAGOGASTRODUODENOSCOPY (EGD) WITH PROPOFOL (N/A) BIOPSY  Patient location during evaluation: PACU Anesthesia Type: MAC Level of consciousness: awake and alert Pain management: pain level controlled Vital Signs Assessment: post-procedure vital signs reviewed and stable Respiratory status: spontaneous breathing Cardiovascular status: blood pressure returned to baseline Postop Assessment: no signs of nausea or vomiting Anesthetic complications: no    Last Vitals:  Vitals:   08/22/16 1355 08/22/16 1400  BP: 107/63   Pulse:    Resp: 10 (!) 25  Temp:      Last Pain:  Vitals:   08/22/16 1312  TempSrc: Oral  PainSc: 5                  Malerie Eakins

## 2016-08-22 NOTE — H&P (Signed)
History and Physical    Kenneth Santiago [id number] DOB: 12-Sep-1941 DOA: 08/21/2016  PCP: Gar Ponto, MD  Patient coming from:  home  Chief Complaint:   Epigastric pain and black tarry stool  HPI: Kenneth Santiago is a 75 y.o. male with medical history significant of diverticulosis, celiac disease, GERD comes in with over one day of worsening epigastric abdomiinal pain with associated black tarry stools.  Pt reports he was on protonix regularly for heart burn which he stopped 9 months ago.  Then the last several days his heartburn returned but much worse than usual.  He denies any nsaid use otc.  No fevers.  No weight loss.  No vomiting.  Pt found to have what looks like a possible duodenal ulcer on ct scan and was referred for admission for UGIB possibly due to ulcer.  Review of Systems: As per HPI otherwise 10 point review of systems negative.   Past Medical History:  Diagnosis Date  . Abdominal pain   . Actinic keratosis   . Basal cell carcinoma   . Celiac disease   . Diabetes ([place])   . Diverticulosis   . Essential hypertension   . GERD (gastroesophageal reflux disease)   . Hyperlipidemia   . Hypothyroidism   . Major depression   . Metabolic syndrome   . Osteoarthritis     Past Surgical History:  Procedure Laterality Date  . BACK SURGERY  1987  . BIOPSY N/A 05/25/2015   Procedure: BIOPSY;  Surgeon: Daneil Dolin, MD;  Location: [facility];  Service: Endoscopy;  Laterality: N/A;  right and descending/sigmoid colon  . CATARACT EXTRACTION  1988  . CHOLECYSTECTOMY    . COLONOSCOPY     2007 at [facility]  . COLONOSCOPY WITH PROPOFOL N/A 05/25/2015   [id number] anal pipilla otherwise normal/pancolonic diverticulosis  . ESOPHAGOGASTRODUODENOSCOPY     2007 at [facility]  . EYES SURGERY       reports that he has never smoked. He has never used smokeless tobacco. He reports that he does not drink alcohol or use drugs.  Allergies  Allergen Reactions  . Iodinated Diagnostic Agents  Rash    Causes Knots on the skin     Family History  Problem Relation Age of Onset  . CVA Father   . Dementia Mother   . Hyperlipidemia Sister   . Hypothyroidism Sister   . Colon cancer Neg Hx     Prior to Admission medications   Medication Sig Start Date End Date Taking? Authorizing Provider  dorzolamide-timolol (COSOPT) 22.3-6.8 MG/ML ophthalmic solution  01/16/15   Historical Provider, MD  latanoprost (XALATAN) 0.005 % ophthalmic solution  04/05/15   Historical Provider, MD  levothyroxine (SYNTHROID, LEVOTHROID) 100 MCG tablet Take 100 mcg by mouth daily before breakfast.    Historical Provider, MD  losartan (COZAAR) 50 MG tablet 1 Tablet(s) PO daily 01/17/15   Historical Provider, MD  polyethylene glycol-electrolytes (NULYTELY/GOLYTELY) 420 G solution Take 4,000 mLs by mouth once. Patient not taking: Reported on 07/03/2015 05/01/15   Carlis Stable, NP  sertraline (ZOLOFT) 50 MG tablet take 1 tablet by mouth once daily for depression 01/17/15   Historical Provider, MD  timolol (TIMOPTIC) 0.25 % ophthalmic solution Place 1 drop into both eyes 2 (two) times daily.    Historical Provider, MD  UNABLE TO FIND RAW PROBIOTICS  - 1 DAILY    Historical Provider, MD    Physical Exam: Vitals:   08/22/16 0000 08/22/16 0100 08/22/16 0130 08/22/16 0200  BP: 145/74 142/72 131/75 141/68  Pulse: (!) 57 (!) 57 60 (!) 56  Resp: 18 19 14 17   Temp:      TempSrc:      SpO2: 100% 94% 97% 99%  Weight:      Height:        Constitutional: NAD, calm, comfortable Vitals:   08/22/16 0000 08/22/16 0100 08/22/16 0130 08/22/16 0200  BP: 145/74 142/72 131/75 141/68  Pulse: (!) 57 (!) 57 60 (!) 56  Resp: 18 19 14 17   Temp:      TempSrc:      SpO2: 100% 94% 97% 99%  Weight:      Height:       Eyes: PERRL, lids and conjunctivae normal ENMT: Mucous membranes are moist. Posterior pharynx clear of any exudate or lesions.Normal dentition.  Neck: normal, supple, no masses, no thyromegaly Respiratory: clear  to auscultation bilaterally, no wheezing, no crackles. Normal respiratory effort. No accessory muscle use.  Cardiovascular: Regular rate and rhythm, no murmurs / rubs / gallops. No extremity edema. 2+ pedal pulses. No carotid bruits.  Abdomen: epigastric tenderness, no masses palpated. No r/g, nondistended No hepatosplenomegaly. Bowel sounds positive.  Musculoskeletal: no clubbing / cyanosis. No joint deformity upper and lower extremities. Good ROM, no contractures. Normal muscle tone.  Skin: no rashes, lesions, ulcers. No induration Neurologic: CN 2-12 grossly intact. Sensation intact, DTR normal. Strength 5/5 in all 4.  Psychiatric: Normal judgment and insight. Alert and oriented x 3. Normal mood.    Labs on Admission: I have personally reviewed following labs and imaging studies  CBC:  Recent Labs Lab 08/21/16 1827  WBC 6.9  HGB 14.5  HCT 42.2  MCV 88.1  PLT 0000000   Basic Metabolic Panel:  Recent Labs Lab 08/21/16 1827  NA 132*  K 3.6  CL 99*  CO2 26  GLUCOSE 368*  BUN 13  CREATININE 0.85  CALCIUM 9.1   GFR: Estimated Creatinine Clearance: 82.4 mL/min (by C-G formula based on SCr of 0.85 mg/dL). Liver Function Tests:  Recent Labs Lab 08/21/16 1827  AST 22  ALT 23  ALKPHOS 78  BILITOT 0.6  PROT 7.1  ALBUMIN 3.7    Recent Labs Lab 08/21/16 1945  LIPASE 37    Cardiac Enzymes:  Recent Labs Lab 08/21/16 1945  TROPONINI 0.03*    Urine analysis:    Component Value Date/Time   COLORURINE YELLOW 08/21/2016 [place] 08/21/2016 2225   LABSPEC 1.020 08/21/2016 2225   PHURINE 5.5 08/21/2016 2225   GLUCOSEU >1000 (A) 08/21/2016 2225   HGBUR NEGATIVE 08/21/2016 2225   BILIRUBINUR NEGATIVE 08/21/2016 2225   KETONESUR NEGATIVE 08/21/2016 2225   PROTEINUR NEGATIVE 08/21/2016 2225   NITRITE NEGATIVE 08/21/2016 2225   LEUKOCYTESUR NEGATIVE 08/21/2016 2225     Radiological Exams on Admission: Ct Abdomen Pelvis Wo Contrast  Result Date:  08/21/2016 CLINICAL DATA:  Acute onset of left upper quadrant abdominal pain and melena. Generalized weakness and shortness of breath. Initial encounter. EXAM: CT ABDOMEN AND PELVIS WITHOUT CONTRAST TECHNIQUE: Multidetector CT imaging of the abdomen and pelvis was performed following the standard protocol without IV contrast. COMPARISON:  None. FINDINGS: Lower chest: The visualized lung bases are grossly clear. Scattered coronary artery calcifications are seen. Hepatobiliary: The liver is unremarkable in appearance. The patient is status post cholecystectomy, with clips noted at the gallbladder fossa. The common bile duct remains normal in caliber. Pancreas: The pancreas is within normal limits. Spleen: The spleen is unremarkable  in appearance. Adrenals/Urinary Tract: The adrenal glands are unremarkable in appearance. The kidneys are within normal limits. There is no evidence of hydronephrosis. No renal or ureteral stones are identified. Nonspecific perinephric stranding is noted bilaterally. Stomach/Bowel: The stomach is unremarkable in appearance. There appears to be mild wall thickening along the proximal duodenum. The appendix is normal in caliber, without evidence of appendicitis. The colon is unremarkable in appearance. Mild diverticulosis is noted along the ascending, descending and sigmoid colon, without evidence of diverticulitis. Vascular/Lymphatic: Scattered calcification is seen along the abdominal aorta and its branches. The abdominal aorta is otherwise grossly unremarkable. The inferior vena cava is grossly unremarkable. No retroperitoneal lymphadenopathy is seen. No pelvic sidewall lymphadenopathy is identified. Reproductive: The bladder is mildly distended. The prostate is enlarged, measuring 5.6 cm transverse dimension, with impression on the base of the bladder. Other: No additional soft tissue abnormalities are seen. Musculoskeletal: No acute osseous abnormalities are identified. Mild disc space  narrowing is noted along the mid to lower lumbar spine. The visualized musculature is unremarkable in appearance. IMPRESSION: 1. Scattered diverticulosis along the ascending, descending and sigmoid colon, without evidence of diverticulitis. 2. Apparent mild wall thickening along the proximal duodenum. Underlying mass cannot be entirely excluded, given the patient's melena. Endoscopy would be helpful for further evaluation, when and as deemed clinically appropriate. 3. Scattered coronary artery calcifications seen. 4. Scattered aortic atherosclerosis. 5. Enlarged prostate, with impression on the base of the bladder. Would correlate with PSA. Electronically Signed   By: Garald Balding M.D.   On: 08/21/2016 23:26   Dg Chest 2 View  Result Date: 08/21/2016 CLINICAL DATA:  Left upper quadrant pain, weakness and dyspnea. EXAM: CHEST  2 VIEW COMPARISON:  Report from 01/21/2014 FINDINGS: The heart is top-normal in size. The thoracic aorta is slightly tortuous without definite aneurysm. And mild hyperinflation of the lungs without pneumonic consolidation, CHF nor effusion. There is osteoarthritic spurring about the AC joints. Small anterior osteophytes are seen along the dorsal spine. No acute osseous abnormality. IMPRESSION: Mild emphysematous hyperinflation of the lungs without acute pulmonary disease. Electronically Signed   By: Ashley Royalty M.D.   On: 08/21/2016 21:34    Assessment/Plan 75 yo male with 1-2 days of worsening epigastric abdominal pain with associated melanotic stools  Principal Problem:   UGIB (upper gastrointestinal bleed)- concerning for ulcerative disease.  Place on protonix 40mg  iv q 12 hours, given 80mg  iv bolus in ED.  Gi cocktail and dilaudid prn for pain.  H/h is over 14.  Keep npo for possible EGD in the am, have requested GI consult for further evaluation.  Pt is hemodynamically stable.  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

## 2016-08-22 NOTE — Op Note (Signed)
[facility] Patient Name: Kenneth Santiago Procedure Date: 08/22/2016 2:00 PM MRN: [id number] Date of Birth: [birth year] Attending MD: Norvel Richards , MD CSN: [id number] Age: 75 Admit Type: Inpatient Procedure:                Upper GI endoscopy with gastric Bx Indications:              Melena Providers:                Norvel Richards, MD, Jeanann Lewandowsky. Sharon Seller, RN,                            Randa Spike, Technician Referring MD:              Medicines:                Propofol per Anesthesia Complications:            No immediate complications. Estimated Blood Loss:     Estimated blood loss was minimal. Procedure:                Pre-Anesthesia Assessment:                           - Prior to the procedure, a History and Physical                            was performed, and patient medications and                            allergies were reviewed. The patient's tolerance of                            previous anesthesia was also reviewed. The risks                            and benefits of the procedure and the sedation                            options and risks were discussed with the patient.                            All questions were answered, and informed consent                            was obtained. Prior Anticoagulants: The patient has                            taken no previous anticoagulant or antiplatelet                            agents. ASA Grade Assessment: II - A patient with                            mild systemic disease. After reviewing the risks  and benefits, the patient was deemed in                            satisfactory condition to undergo the procedure.                           After obtaining informed consent, the endoscope was                            passed under direct vision. Throughout the                            procedure, the patient's blood pressure, pulse, and                            oxygen  saturations were monitored continuously. The                            [id number]) was introduced through the and                            advanced to the second part of duodenum. The upper                            GI endoscopy was accomplished without difficulty.                            The patient tolerated the procedure well. Scope In: 2:09:30 PM Scope Out: 2:14:52 PM Total Procedure Duration: 0 hours 5 minutes 22 seconds  Findings:      The examined esophagus was normal.      A medium-sized hiatal hernia was present.      One non-bleeding cratered duodenal ulcer with no stigmata of bleeding       was found in the first portion of the duodenum. The lesion was 12 mm in       largest dimension. Gastric biopsies done for H. pylori. Impression:               - Normal esophagus.                           - Medium-sized hiatal hernia.                           - One non-bleeding duodenal ulcer with no stigmata                            of bleeding.                           - No specimens collected. Moderate Sedation:      Moderate (conscious) sedation was personally administered by an       anesthesia professional. The following parameters were monitored: oxygen       saturation, heart rate, blood pressure, respiratory rate, EKG, adequacy       of pulmonary ventilation, and response to care. Total physician  intraservice time was 14 minutes. Recommendation:           - Return patient to hospital ward for ongoing care.                           - Diabetic (ADA) diet.                           - Continue present medications.                           - No repeat upper endoscopy. Continue Protonix 40                            mg twice daily. Follow up on pathology. Avoid all                            forms of aspirin/NSAIDs.                           - Return to GI office in 3 months. Procedure Code(s):        --- Professional ---                           [id number],  Esophagogastroduodenoscopy, flexible,                            transoral; diagnostic, including collection of                            specimen(s) by brushing or washing, when performed                            (separate procedure) Diagnosis Code(s):        --- Professional ---                           K44.9, Diaphragmatic hernia without obstruction or                            gangrene                           K26.9, Duodenal ulcer, unspecified as acute or                            chronic, without hemorrhage or perforation                           K92.1, Melena (includes Hematochezia) CPT copyright 2016 American Medical Association. All rights reserved. The codes documented in this report are preliminary and upon coder review may  be revised to meet current compliance requirements. Cristopher Estimable. Simi Briel, MD Norvel Richards, MD 08/22/2016 2:25:48 PM This report has been signed electronically. Number of Addenda: 0

## 2016-08-22 NOTE — Progress Notes (Signed)
Inpatient Diabetes Program Recommendations  AACE/ADA: New Consensus Statement on Inpatient Glycemic Control (2015)  Target Ranges:  Prepandial:   less than 140 mg/dL      Peak postprandial:   less than 180 mg/dL (1-2 hours)      Critically ill patients:  140 - 180 mg/dL   Results for SOCTT, OBROCHTA (MRN [id number]) as of 08/22/2016 11:21  Ref. Range 08/22/2016 04:17 08/22/2016 08:47 08/22/2016 11:18  Glucose-Capillary Latest Ref Range: 65 - 99 mg/dL 189 (H) 179 (H) 185 (H)  Results for RALEN, KAMPE (MRN [id number]) as of 08/22/2016 11:21  Ref. Range 08/21/2016 18:27  Glucose Latest Ref Range: 65 - 99 mg/dL 368 (H)   Review of Glycemic Control  Diabetes history: DM2 Outpatient Diabetes medications: None Current orders for Inpatient glycemic control: Novolog 0-9 units Q4H  Inpatient Diabetes Program Recommendations: HgbA1C: Please consider addin on an A1C to blood in lab (if available) or draw with next scheduled blood draw. Follow Up: Initial lab glucose 368 mg/dl on 08/21/16 and patient has a documented history of diabetes and no DM medications listed on home medication list. Patient will likely need to be prescribed DM medication at time of discharge and will need to follow up with PCP regarding DM control.  Thanks, Barnie Alderman, RN, MSN, CDE Diabetes Coordinator Inpatient Diabetes Program [phone] (Team Pager from 8am to 5pm)

## 2016-08-22 NOTE — Consult Note (Signed)
Referring Provider: Mikki Harbor* Primary Care Physician:  Gar Ponto, MD Primary Gastroenterologist:  Garfield Cornea, MD  Reason for Consultation:  Melena, abdominal pain  HPI: Kenneth Santiago is a 75 y.o. male to the emergency department with complaints of melena, abdominal pain. Patient has a history of questionable celiac disease, diverticulosis. Reports "varicose veins" noted at time of his upper endoscopy over 10 years ago. I do not have that report.  Patient states he started passing black stool on Tuesday. Last episode was during the middle of the night. Last bowel movement also with a little bit of "strings of red blood". Patient complains of both epigastric pain and left lower quadrant pain. Feels like he has bulging in the left lower quadrant region. Daughter states he's been having abdominal swelling especially with eating. He denies typical heartburn. No dysphagia. No reported weight loss. No vomiting.  In the emergency department he had a CT of the abdomen and pelvis with oral contrast only showing mild wall thickening along the proximal duodenum, underlying mass cannot be excluded. Hemoglobin at presentation was 14.5. He was seen positive. Hemoglobin this morning 12.6. Platelet count 133,000, albumin normal, no PT/INR available. No evidence of cirrhosis on imaging. He denies NSAID or aspirin use.   Prior to Admission medications   Medication Sig Start Date End Date Taking? Authorizing Provider  dorzolamide-timolol (COSOPT) 22.3-6.8 MG/ML ophthalmic solution  01/16/15   Historical Provider, MD  latanoprost (XALATAN) 0.005 % ophthalmic solution  04/05/15   Historical Provider, MD  levothyroxine (SYNTHROID, LEVOTHROID) 100 MCG tablet Take 100 mcg by mouth daily before breakfast.    Historical Provider, MD  losartan (COZAAR) 50 MG tablet 1 Tablet(s) PO daily 01/17/15   Historical Provider, MD          sertraline (ZOLOFT) 50 MG tablet take 1 tablet by mouth once daily for  depression 01/17/15   Historical Provider, MD  timolol (TIMOPTIC) 0.25 % ophthalmic solution Place 1 drop into both eyes 2 (two) times daily.    Historical Provider, MD  UNABLE TO FIND RAW PROBIOTICS  - 1 DAILY    Historical Provider, MD    Current Facility-Administered Medications  Medication Dose Route Frequency Provider Last Rate Last Dose  . 0.9 %  sodium chloride infusion  250 mL Intravenous PRN Phillips Grout, MD      . gi cocktail (Maalox,Lidocaine,Donnatal)  30 mL Oral TID PRN Phillips Grout, MD      . HYDROmorphone (DILAUDID) injection 0.5 mg  0.5 mg Intravenous Q2H PRN Phillips Grout, MD      . insulin aspart (novoLOG) injection 0-9 Units  0-9 Units Subcutaneous Q4H Phillips Grout, MD   2 Units at 08/22/16 0400  . ondansetron (ZOFRAN) tablet 4 mg  4 mg Oral Q6H PRN Phillips Grout, MD       Or  . ondansetron (ZOFRAN) injection 4 mg  4 mg Intravenous Q6H PRN Phillips Grout, MD      . pantoprazole (PROTONIX) injection 40 mg  40 mg Intravenous Q12H Phillips Grout, MD      . sodium chloride flush (NS) 0.9 % injection 3 mL  3 mL Intravenous Q12H Phillips Grout, MD      . sodium chloride flush (NS) 0.9 % injection 3 mL  3 mL Intravenous PRN Phillips Grout, MD        Allergies as of 08/21/2016 - Review Complete 08/21/2016  Allergen Reaction Noted  . Iodinated diagnostic  agents Rash 04/03/2015    Past Medical History:  Diagnosis Date  . Abdominal pain   . Actinic keratosis   . Basal cell carcinoma   . Celiac disease    ???   . Diabetes ([place])   . Diverticulosis   . Essential hypertension   . GERD (gastroesophageal reflux disease)   . Hyperlipidemia   . Hypothyroidism   . Major depression   . Metabolic syndrome   . Osteoarthritis     Past Surgical History:  Procedure Laterality Date  . BACK SURGERY  1987  . BIOPSY N/A 05/25/2015   Procedure: BIOPSY;  Surgeon: Daneil Dolin, MD;  Location: [facility];  Service: Endoscopy;  Laterality: N/A;  right and descending/sigmoid colon  .  CATARACT EXTRACTION  1988  . CHOLECYSTECTOMY    . COLONOSCOPY     2007 at [facility]  . COLONOSCOPY WITH PROPOFOL N/A 05/25/2015   [id number] anal pipilla otherwise normal/pancolonic diverticulosis  . ESOPHAGOGASTRODUODENOSCOPY     2007 at [facility]  . EYES SURGERY      Family History  Problem Relation Age of Onset  . CVA Father   . Dementia Mother   . Hyperlipidemia Sister   . Hypothyroidism Sister   . Colon cancer Neg Hx     Social History   Social History  . Marital status: Widowed    Spouse name: N/A  . Number of children: N/A  . Years of education: N/A   Occupational History  . Not on file.   Social History Main Topics  . Smoking status: Never Smoker  . Smokeless tobacco: Never Used  . Alcohol use No  . Drug use: No  . Sexual activity: Not on file   Other Topics Concern  . Not on file   Social History Narrative  . No narrative on file     ROS:  General: Negative for anorexia, weight loss, fever, chills, fatigue, weakness. Eyes: Negative for vision changes.  ENT: Negative for hoarseness, difficulty swallowing , nasal congestion. CV: Negative for chest pain, angina, palpitations, dyspnea on exertion, peripheral edema.  Respiratory: Negative for dyspnea at rest, dyspnea on exertion, cough, sputum, wheezing.  GI: See history of present illness. GU:  Negative for dysuria, hematuria, urinary incontinence, urinary frequency, nocturnal urination.  MS: Negative for joint pain, low back pain.  Derm: Negative for rash or itching.  Neuro: Negative for weakness, abnormal sensation, seizure, frequent headaches, memory loss, confusion.  Psych: Negative for anxiety, depression, suicidal ideation, hallucinations.  Endo: Negative for unusual weight change.  Heme: Negative for bruising or bleeding. Allergy: Negative for rash or hives.       Physical Examination: Vital signs in last 24 hours: Temp:  [98 F (36.7 C)-98.5 F (36.9 C)] 98.2 F (36.8 C) (11/30 0631) Pulse  Rate:  [55-76] 57 (11/30 0631) Resp:  [14-20] 18 (11/30 0631) BP: (114-168)/(53-79) 114/59 (11/30 0631) SpO2:  [93 %-100 %] 98 % (11/30 0631) Weight:  [180 lb (81.6 kg)-196 lb 6.9 oz (89.1 kg)] 196 lb 6.9 oz (89.1 kg) (11/30 0330)    General: Well-nourished, well-developed in no acute distress. Accompanied by his daughter. Head: Normocephalic, atraumatic.   Eyes: Conjunctiva pink, no icterus. Mouth: Oropharyngeal mucosa moist and pink , no lesions erythema or exudate. Neck: Supple without thyromegaly, masses, or lymphadenopathy.  Lungs: Clear to auscultation bilaterally.  Heart: Regular rate and rhythm, no murmurs rubs or gallops.  Abdomen: Bowel sounds are normal, moderate epigastric tenderness, mild left lower quadrant tenderness did not appreciate  mass or bulging.  nondistended, no hepatosplenomegaly or masses, no abdominal bruits or    hernia , no rebound or guarding.   Rectal: In the ER, black stool in the rectal vault, heme positive. Extremities: No lower extremity edema, clubbing, deformity.  Neuro: Alert and oriented x 4 , grossly normal neurologically.  Skin: Warm and dry, no rash or jaundice.   Psych: Alert and cooperative, normal mood and affect.        Intake/Output from previous day: 11/29 0701 - 11/30 0700 In: 2000 [I.V.:1000; IV Piggyback:1000] Out: -  Intake/Output this shift: No intake/output data recorded.  Lab Results: CBC  Recent Labs  08/21/16 1827 08/22/16 0502  WBC 6.9 6.6  HGB 14.5 12.6*  HCT 42.2 36.7*  MCV 88.1 88.0  PLT 188 133*   BMET  Recent Labs  08/21/16 1827 08/22/16 0502  NA 132* 136  K 3.6 3.1*  CL 99* 106  CO2 26 24  GLUCOSE 368* 184*  BUN 13 8  CREATININE 0.85 0.70  CALCIUM 9.1 8.1*   LFT  Recent Labs  08/21/16 1827  BILITOT 0.6  ALKPHOS 78  AST 22  ALT 23  PROT 7.1  ALBUMIN 3.7    Lipase  Recent Labs  08/21/16 1945  LIPASE 37    PT/INR No results for input(s): LABPROT, INR in the last 72 hours.     Imaging Studies: Ct Abdomen Pelvis Wo Contrast  Result Date: 08/21/2016 CLINICAL DATA:  Acute onset of left upper quadrant abdominal pain and melena. Generalized weakness and shortness of breath. Initial encounter. EXAM: CT ABDOMEN AND PELVIS WITHOUT CONTRAST TECHNIQUE: Multidetector CT imaging of the abdomen and pelvis was performed following the standard protocol without IV contrast. COMPARISON:  None. FINDINGS: Lower chest: The visualized lung bases are grossly clear. Scattered coronary artery calcifications are seen. Hepatobiliary: The liver is unremarkable in appearance. The patient is status post cholecystectomy, with clips noted at the gallbladder fossa. The common bile duct remains normal in caliber. Pancreas: The pancreas is within normal limits. Spleen: The spleen is unremarkable in appearance. Adrenals/Urinary Tract: The adrenal glands are unremarkable in appearance. The kidneys are within normal limits. There is no evidence of hydronephrosis. No renal or ureteral stones are identified. Nonspecific perinephric stranding is noted bilaterally. Stomach/Bowel: The stomach is unremarkable in appearance. There appears to be mild wall thickening along the proximal duodenum. The appendix is normal in caliber, without evidence of appendicitis. The colon is unremarkable in appearance. Mild diverticulosis is noted along the ascending, descending and sigmoid colon, without evidence of diverticulitis. Vascular/Lymphatic: Scattered calcification is seen along the abdominal aorta and its branches. The abdominal aorta is otherwise grossly unremarkable. The inferior vena cava is grossly unremarkable. No retroperitoneal lymphadenopathy is seen. No pelvic sidewall lymphadenopathy is identified. Reproductive: The bladder is mildly distended. The prostate is enlarged, measuring 5.6 cm transverse dimension, with impression on the base of the bladder. Other: No additional soft tissue abnormalities are seen.  Musculoskeletal: No acute osseous abnormalities are identified. Mild disc space narrowing is noted along the mid to lower lumbar spine. The visualized musculature is unremarkable in appearance. IMPRESSION: 1. Scattered diverticulosis along the ascending, descending and sigmoid colon, without evidence of diverticulitis. 2. Apparent mild wall thickening along the proximal duodenum. Underlying mass cannot be entirely excluded, given the patient's melena. Endoscopy would be helpful for further evaluation, when and as deemed clinically appropriate. 3. Scattered coronary artery calcifications seen. 4. Scattered aortic atherosclerosis. 5. Enlarged prostate, with impression on the base  of the bladder. Would correlate with PSA. Electronically Signed   By: Garald Balding M.D.   On: 08/21/2016 23:26   Dg Chest 2 View  Result Date: 08/21/2016 CLINICAL DATA:  Left upper quadrant pain, weakness and dyspnea. EXAM: CHEST  2 VIEW COMPARISON:  Report from 01/21/2014 FINDINGS: The heart is top-normal in size. The thoracic aorta is slightly tortuous without definite aneurysm. And mild hyperinflation of the lungs without pneumonic consolidation, CHF nor effusion. There is osteoarthritic spurring about the AC joints. Small anterior osteophytes are seen along the dorsal spine. No acute osseous abnormality. IMPRESSION: Mild emphysematous hyperinflation of the lungs without acute pulmonary disease. Electronically Signed   By: Ashley Royalty M.D.   On: 08/21/2016 21:34  [4 week]   Impression: 75 year old gentleman presenting with acute onset melena, epigastric pain. Also with some more chronic left lower quadrant fullness/discomfort. CT imaging concerning for abnormality in the duodenum. Colonoscopy up to date, 2016.  Interestingly, patient states he had a remote EGD and had "varicose veins" noted. Details are not available, patient cannot recall who performed the procedure. No indication of cirrhosis or portal hypertension on  imaging. Suspect upper GI bleed related to ulcer but malignancy cannot be excluded. Discussed with patient and daughter at bedside.   Plan: 1. We will plan on upper endoscopy later today, deep sedation in the OR given patient's prior inadequate conscious sedation. 2. Replete potassium as ordered. Discussed with RN, Lattie Haw.  3. Continue PPI BID.  We would like to thank you for the opportunity to participate in the care of NAHSIR IULIANO.  Laureen Ochs. Bernarda Caffey [facility] [phone] 11/30/20179:29 AM    LOS: 0 days

## 2016-08-22 NOTE — Transfer of Care (Signed)
Immediate Anesthesia Transfer of Care Note  Patient: Kenneth Santiago  Procedure(s) Performed: Procedure(s) with comments: ESOPHAGOGASTRODUODENOSCOPY (EGD) WITH PROPOFOL (N/A) BIOPSY - gastric     Patient Location: PACU  Anesthesia Type:MAC  Level of Consciousness: awake  Airway & Oxygen Therapy: Patient Spontanous Breathing and Patient connected to nasal cannula oxygen  Post-op Assessment: Report given to RN  Post vital signs: Reviewed and stable  Last Vitals:  Vitals:   08/22/16 1355 08/22/16 1400  BP: 107/63   Pulse:    Resp: 10 (!) 25  Temp:      Last Pain:  Vitals:   08/22/16 1312  TempSrc: Oral  PainSc: 5       Patients Stated Pain Goal: 4 ([id number])  Complications: No apparent anesthesia complications

## 2016-08-22 NOTE — Care Management Obs Status (Signed)
[place] NOTIFICATION   Patient Details  Name: Kenneth Santiago MRN: [id number] Date of Birth: [birth year]   Medicare Observation Status Notification Given:  Yes    Zissy Hamlett, Chauncey Reading, RN 08/22/2016, 2:55 PM

## 2016-08-22 NOTE — Anesthesia Preprocedure Evaluation (Addendum)
Anesthesia Evaluation  Patient identified by MRN, date of birth, ID band Patient awake    Reviewed: Allergy & Precautions, NPO status , Patient's Chart, lab work & pertinent test results  Airway Mallampati: II  TM Distance: >3 FB     Dental  (+) Partial Upper, Partial Lower   Pulmonary neg pulmonary ROS,    breath sounds clear to auscultation       Cardiovascular hypertension, Pt. on medications  Rhythm:Regular Rate:Normal     Neuro/Psych PSYCHIATRIC DISORDERS Depression    GI/Hepatic GERD  ,  Endo/Other  diabetes, Poorly Controlled, Type 2Hypothyroidism   Renal/GU      Musculoskeletal   Abdominal   Peds  Hematology   Anesthesia Other Findings   Reproductive/Obstetrics                             Anesthesia Physical Anesthesia Plan  ASA: III  Anesthesia Plan: MAC   Post-op Pain Management:    Induction: Intravenous  Airway Management Planned: Simple Face Mask  Additional Equipment:   Intra-op Plan:   Post-operative Plan:   Informed Consent: I have reviewed the patients History and Physical, chart, labs and discussed the procedure including the risks, benefits and alternatives for the proposed anesthesia with the patient or authorized representative who has indicated his/her understanding and acceptance.     Plan Discussed with:   Anesthesia Plan Comments:         Anesthesia Quick Evaluation

## 2016-08-22 NOTE — Progress Notes (Signed)
Patient seen and examined. Database reviewed. Discussed with wife at bedside. Patient presents with melena and epigastric pains. Suspect PUD. Seen by GI with recommendations for EGD later today. Will continue to follow.  Domingo Mend, MD [facility] Pager: [phone]

## 2016-08-23 ENCOUNTER — Encounter (HOSPITAL_COMMUNITY): Payer: Self-pay | Admitting: Internal Medicine

## 2016-08-23 DIAGNOSIS — R1013 Epigastric pain: Secondary | ICD-10-CM | POA: Diagnosis not present

## 2016-08-23 DIAGNOSIS — K269 Duodenal ulcer, unspecified as acute or chronic, without hemorrhage or perforation: Secondary | ICD-10-CM | POA: Diagnosis not present

## 2016-08-23 DIAGNOSIS — K922 Gastrointestinal hemorrhage, unspecified: Secondary | ICD-10-CM | POA: Diagnosis not present

## 2016-08-23 DIAGNOSIS — K921 Melena: Secondary | ICD-10-CM | POA: Diagnosis not present

## 2016-08-23 LAB — GLUCOSE, CAPILLARY
GLUCOSE-CAPILLARY: 121 mg/dL — AB (ref 65–99)
GLUCOSE-CAPILLARY: 131 mg/dL — AB (ref 65–99)
GLUCOSE-CAPILLARY: 231 mg/dL — AB (ref 65–99)
Glucose-Capillary: 122 mg/dL — ABNORMAL HIGH (ref 65–99)

## 2016-08-23 LAB — CBC
HEMATOCRIT: 37.7 % — AB (ref 39.0–52.0)
HEMOGLOBIN: 12.8 g/dL — AB (ref 13.0–17.0)
MCH: 30.2 pg (ref 26.0–34.0)
MCHC: 34 g/dL (ref 30.0–36.0)
MCV: 88.9 fL (ref 78.0–100.0)
Platelets: 133 10*3/uL — ABNORMAL LOW (ref 150–400)
RBC: 4.24 MIL/uL (ref 4.22–5.81)
RDW: 13.5 % (ref 11.5–15.5)
WBC: 5.5 10*3/uL (ref 4.0–10.5)

## 2016-08-23 LAB — URINE CULTURE: Culture: <10000 — AB

## 2016-08-23 MED ORDER — PANTOPRAZOLE SODIUM 40 MG PO TBEC: 40.0000 mg | DELAYED_RELEASE_TABLET | Freq: Two times a day (BID) | ORAL | 2 refills | Status: AC

## 2016-08-23 NOTE — Addendum Note (Signed)
Addendum  created 08/23/16 [id number] by Ollen Bowl, CRNA   Sign clinical note

## 2016-08-23 NOTE — Progress Notes (Signed)
Patient discharged home  IV removed - WNL.  Reviewed DC instructions and medications.  Instructed to follow up with PCP within 2 weeks.  No questions at this time.  Verbalizes understanding.  Assisted off unit in NAD.

## 2016-08-23 NOTE — Discharge Summary (Signed)
Physician Discharge Summary  Kenneth Santiago [id number] DOB: 06/18/1941 DOA: 08/21/2016  PCP: Gar Ponto, MD  Admit date: 08/21/2016 Discharge date: 08/23/2016  Time spent: 45 minutes  Recommendations for Outpatient Follow-up:  -Will be discharged home today. -Advised to follow up with Dr. Sydell Axon as scheduled by his office.   Discharge Diagnoses:  Principal Problem:   UGIB (upper gastrointestinal bleed) Active Problems:   Duodenal anomaly   Abdominal pain, acute, epigastric   Metabolic syndrome   Celiac disease   Essential hypertension   Melena   Black stools   Abnormal CT scan, small bowel   Discharge Condition: Stable and improved  Filed Weights   08/21/16 1759 08/22/16 0330 08/23/16 0438  Weight: 81.6 kg (180 lb) 89.1 kg (196 lb 6.9 oz) 88.5 kg (195 lb 3.2 oz)    History of present illness:  As per Dr. Shanon Brow on 11/30: FREDICK WEAKS is a 75 y.o. male with medical history significant of diverticulosis, celiac disease, GERD comes in with over one day of worsening epigastric abdomiinal pain with associated black tarry stools.  Pt reports he was on protonix regularly for heart burn which he stopped 9 months ago.  Then the last several days his heartburn returned but much worse than usual.  He denies any nsaid use otc.  No fevers.  No weight loss.  No vomiting.  Pt found to have what looks like a possible duodenal ulcer on ct scan and was referred for admission for UGIB possibly due to ulcer.   Hospital Course:   Melena/Epigastric Pain -Due to duodenal ulcer seen on EGD. -Plan for BID PPI. -Hb has been stable around 12.8 without need for blood transfusion.  Rest of chronic medical issues have been stable this admission.  Procedures: EGD: Impression:               - Normal esophagus.                           - Medium-sized hiatal hernia.                           - One non-bleeding duodenal ulcer with no stigmata                            of  bleeding.                            - No specimens collected.  Consultations:  GI  Discharge Instructions  Discharge Instructions    Diet - low sodium heart healthy    Complete by:  As directed    Increase activity slowly    Complete by:  As directed        Medication List    TAKE these medications   latanoprost 0.005 % ophthalmic solution Commonly known as:  XALATAN 1 drop 2 (two) times daily.   levothyroxine 125 MCG tablet Commonly known as:  SYNTHROID, LEVOTHROID Take 125 mcg by mouth daily before breakfast.   losartan 50 MG tablet Commonly known as:  COZAAR 1 Tablet(s) PO daily   pantoprazole 40 MG tablet Commonly known as:  PROTONIX Take 1 tablet (40 mg total) by mouth 2 (two) times daily.   sertraline 50 MG tablet Commonly known as:  ZOLOFT take 1 tablet by mouth once daily for  depression      Allergies  Allergen Reactions  . Iodinated Diagnostic Agents Rash    Causes Knots on the skin    Follow-up Information    Gar Ponto, MD. Schedule an appointment as soon as possible for a visit in 2 week(s).   Specialty:  Family Medicine Contact information: [place] [address] [phone]            The results of significant diagnostics from this hospitalization (including imaging, microbiology, ancillary and laboratory) are listed below for reference.    Significant Diagnostic Studies: Ct Abdomen Pelvis Wo Contrast  Result Date: 08/21/2016 CLINICAL DATA:  Acute onset of left upper quadrant abdominal pain and melena. Generalized weakness and shortness of breath. Initial encounter. EXAM: CT ABDOMEN AND PELVIS WITHOUT CONTRAST TECHNIQUE: Multidetector CT imaging of the abdomen and pelvis was performed following the standard protocol without IV contrast. COMPARISON:  None. FINDINGS: Lower chest: The visualized lung bases are grossly clear. Scattered coronary artery calcifications are seen. Hepatobiliary: The liver is unremarkable in  appearance. The patient is status post cholecystectomy, with clips noted at the gallbladder fossa. The common bile duct remains normal in caliber. Pancreas: The pancreas is within normal limits. Spleen: The spleen is unremarkable in appearance. Adrenals/Urinary Tract: The adrenal glands are unremarkable in appearance. The kidneys are within normal limits. There is no evidence of hydronephrosis. No renal or ureteral stones are identified. Nonspecific perinephric stranding is noted bilaterally. Stomach/Bowel: The stomach is unremarkable in appearance. There appears to be mild wall thickening along the proximal duodenum. The appendix is normal in caliber, without evidence of appendicitis. The colon is unremarkable in appearance. Mild diverticulosis is noted along the ascending, descending and sigmoid colon, without evidence of diverticulitis. Vascular/Lymphatic: Scattered calcification is seen along the abdominal aorta and its branches. The abdominal aorta is otherwise grossly unremarkable. The inferior vena cava is grossly unremarkable. No retroperitoneal lymphadenopathy is seen. No pelvic sidewall lymphadenopathy is identified. Reproductive: The bladder is mildly distended. The prostate is enlarged, measuring 5.6 cm transverse dimension, with impression on the base of the bladder. Other: No additional soft tissue abnormalities are seen. Musculoskeletal: No acute osseous abnormalities are identified. Mild disc space narrowing is noted along the mid to lower lumbar spine. The visualized musculature is unremarkable in appearance. IMPRESSION: 1. Scattered diverticulosis along the ascending, descending and sigmoid colon, without evidence of diverticulitis. 2. Apparent mild wall thickening along the proximal duodenum. Underlying mass cannot be entirely excluded, given the patient's melena. Endoscopy would be helpful for further evaluation, when and as deemed clinically appropriate. 3. Scattered coronary artery  calcifications seen. 4. Scattered aortic atherosclerosis. 5. Enlarged prostate, with impression on the base of the bladder. Would correlate with PSA. Electronically Signed   By: Garald Balding M.D.   On: 08/21/2016 23:26   Dg Chest 2 View  Result Date: 08/21/2016 CLINICAL DATA:  Left upper quadrant pain, weakness and dyspnea. EXAM: CHEST  2 VIEW COMPARISON:  Report from 01/21/2014 FINDINGS: The heart is top-normal in size. The thoracic aorta is slightly tortuous without definite aneurysm. And mild hyperinflation of the lungs without pneumonic consolidation, CHF nor effusion. There is osteoarthritic spurring about the AC joints. Small anterior osteophytes are seen along the dorsal spine. No acute osseous abnormality. IMPRESSION: Mild emphysematous hyperinflation of the lungs without acute pulmonary disease. Electronically Signed   By: Ashley Royalty M.D.   On: 08/21/2016 21:34    Microbiology: Recent Results (from the past 240 hour(s))  Urine culture  Status: Abnormal   Collection Time: 08/21/16 10:25 PM  Result Value Ref Range Status   Specimen Description URINE, CLEAN CATCH  Final   Special Requests NONE  Final   Culture (A)  Final    <10,000 COLONIES/mL INSIGNIFICANT GROWTH Performed at [facility]    Report Status 08/23/2016 FINAL  Final     Labs: Basic Metabolic Panel:  Recent Labs Lab 08/21/16 1827 08/22/16 0502  NA 132* 136  K 3.6 3.1*  CL 99* 106  CO2 26 24  GLUCOSE 368* 184*  BUN 13 8  CREATININE 0.85 0.70  CALCIUM 9.1 8.1*   Liver Function Tests:  Recent Labs Lab 08/21/16 1827  AST 22  ALT 23  ALKPHOS 78  BILITOT 0.6  PROT 7.1  ALBUMIN 3.7    Recent Labs Lab 08/21/16 1945  LIPASE 37   No results for input(s): AMMONIA in the last 168 hours. CBC:  Recent Labs Lab 08/21/16 1827 08/22/16 0502 08/23/16 0449  WBC 6.9 6.6 5.5  HGB 14.5 12.6* 12.8*  HCT 42.2 36.7* 37.7*  MCV 88.1 88.0 88.9  PLT 188 133* 133*   Cardiac Enzymes:  Recent  Labs Lab 08/21/16 1945  TROPONINI 0.03*   BNP: BNP (last 3 results) No results for input(s): BNP in the last 8760 hours.  ProBNP (last 3 results) No results for input(s): PROBNP in the last 8760 hours.  CBG:  Recent Labs Lab 08/22/16 2012 08/23/16 0005 08/23/16 0436 08/23/16 0806 08/23/16 1120  GLUCAP 256* 122* 121* 131* 231*       Signed:  HERNANDEZ ACOSTA,[place]  [facility] Pager: [phone] 08/23/2016, 11:24 AM

## 2016-08-23 NOTE — Anesthesia Postprocedure Evaluation (Signed)
Anesthesia Post Note  Patient: Kenneth Santiago  Procedure(s) Performed: Procedure(s) (LRB): ESOPHAGOGASTRODUODENOSCOPY (EGD) WITH PROPOFOL (N/A) BIOPSY  Patient location during evaluation: Nursing Unit Anesthesia Type: MAC Level of consciousness: awake and alert and oriented Pain management: pain level controlled Vital Signs Assessment: post-procedure vital signs reviewed and stable Respiratory status: spontaneous breathing Cardiovascular status: blood pressure returned to baseline Postop Assessment: no signs of nausea or vomiting and adequate PO intake Anesthetic complications: no    Last Vitals:  Vitals:   08/22/16 2016 08/23/16 0438  BP: 117/69 (!) 118/59  Pulse: (!) 57 (!) 50  Resp: 16 16  Temp: 36.7 C 36.6 C    Last Pain:  Vitals:   08/23/16 0438  TempSrc: Oral  PainSc:                  Tressie Stalker

## 2016-08-23 NOTE — Progress Notes (Signed)
REVIEWED-NO ADDITIONAL RECOMMENDATIONS.   Subjective: Feels ok today, wants to go home. Mild abdominal tenderness. No signs of rebleeding. No other upper or lower GI complaints.  Objective: Vital signs in last 24 hours: Temp:  [97.8 F (36.6 C)-98 F (36.7 C)] 97.8 F (36.6 C) (12/01 0438) Pulse Rate:  [50-57] 50 (12/01 0438) Resp:  [16] 16 (12/01 0438) BP: (117-135)/(59-73) 118/59 (12/01 0438) SpO2:  [97 %-99 %] 98 % (12/01 0438) Weight:  [195 lb 3.2 oz (88.5 kg)] 195 lb 3.2 oz (88.5 kg) (12/01 0438) Last BM Date: 08/22/16 General:   Alert and oriented, pleasant Heart:  S1, S2 present, no murmurs noted.  Lungs: Clear to auscultation bilaterally, without wheezing, rales, or rhonchi.  Abdomen:  Bowel sounds present, soft, non-distended. Mild abdominal TTP noted. No HSM or hernias noted. No rebound or guarding. No masses appreciated  Extremities:  Without clubbing or edema. Neurologic:  Alert and  oriented x4;  grossly normal neurologically. Psych:  Alert and cooperative. Normal mood and affect.  Intake/Output from previous day: 11/30 0701 - 12/01 0700 In: 686 [P.O.:280; I.V.:406] Out: 1500 [Urine:1500] Intake/Output this shift: Total I/O In: 480 [P.O.:480] Out: -   Lab Results:  Recent Labs  08/21/16 1827 08/22/16 0502 08/23/16 0449  WBC 6.9 6.6 5.5  HGB 14.5 12.6* 12.8*  HCT 42.2 36.7* 37.7*  PLT 188 133* 133*   BMET  Recent Labs  08/21/16 1827 08/22/16 0502  NA 132* 136  K 3.6 3.1*  CL 99* 106  CO2 26 24  GLUCOSE 368* 184*  BUN 13 8  CREATININE 0.85 0.70  CALCIUM 9.1 8.1*   LFT  Recent Labs  08/21/16 1827  PROT 7.1  ALBUMIN 3.7  AST 22  ALT 23  ALKPHOS 78  BILITOT 0.6   PT/INR No results for input(s): LABPROT, INR in the last 72 hours. Hepatitis Panel No results for input(s): HEPBSAG, HCVAB, HEPAIGM, HEPBIGM in the last 72 hours.   Studies/Results: Ct Abdomen Pelvis Wo Contrast  Result Date: 08/21/2016 CLINICAL DATA:  Acute onset of  left upper quadrant abdominal pain and melena. Generalized weakness and shortness of breath. Initial encounter. EXAM: CT ABDOMEN AND PELVIS WITHOUT CONTRAST TECHNIQUE: Multidetector CT imaging of the abdomen and pelvis was performed following the standard protocol without IV contrast. COMPARISON:  None. FINDINGS: Lower chest: The visualized lung bases are grossly clear. Scattered coronary artery calcifications are seen. Hepatobiliary: The liver is unremarkable in appearance. The patient is status post cholecystectomy, with clips noted at the gallbladder fossa. The common bile duct remains normal in caliber. Pancreas: The pancreas is within normal limits. Spleen: The spleen is unremarkable in appearance. Adrenals/Urinary Tract: The adrenal glands are unremarkable in appearance. The kidneys are within normal limits. There is no evidence of hydronephrosis. No renal or ureteral stones are identified. Nonspecific perinephric stranding is noted bilaterally. Stomach/Bowel: The stomach is unremarkable in appearance. There appears to be mild wall thickening along the proximal duodenum. The appendix is normal in caliber, without evidence of appendicitis. The colon is unremarkable in appearance. Mild diverticulosis is noted along the ascending, descending and sigmoid colon, without evidence of diverticulitis. Vascular/Lymphatic: Scattered calcification is seen along the abdominal aorta and its branches. The abdominal aorta is otherwise grossly unremarkable. The inferior vena cava is grossly unremarkable. No retroperitoneal lymphadenopathy is seen. No pelvic sidewall lymphadenopathy is identified. Reproductive: The bladder is mildly distended. The prostate is enlarged, measuring 5.6 cm transverse dimension, with impression on the base of the bladder. Other: No additional  soft tissue abnormalities are seen. Musculoskeletal: No acute osseous abnormalities are identified. Mild disc space narrowing is noted along the mid to lower  lumbar spine. The visualized musculature is unremarkable in appearance. IMPRESSION: 1. Scattered diverticulosis along the ascending, descending and sigmoid colon, without evidence of diverticulitis. 2. Apparent mild wall thickening along the proximal duodenum. Underlying mass cannot be entirely excluded, given the patient's melena. Endoscopy would be helpful for further evaluation, when and as deemed clinically appropriate. 3. Scattered coronary artery calcifications seen. 4. Scattered aortic atherosclerosis. 5. Enlarged prostate, with impression on the base of the bladder. Would correlate with PSA. Electronically Signed   By: Garald Balding M.D.   On: 08/21/2016 23:26   Dg Chest 2 View  Result Date: 08/21/2016 CLINICAL DATA:  Left upper quadrant pain, weakness and dyspnea. EXAM: CHEST  2 VIEW COMPARISON:  Report from 01/21/2014 FINDINGS: The heart is top-normal in size. The thoracic aorta is slightly tortuous without definite aneurysm. And mild hyperinflation of the lungs without pneumonic consolidation, CHF nor effusion. There is osteoarthritic spurring about the AC joints. Small anterior osteophytes are seen along the dorsal spine. No acute osseous abnormality. IMPRESSION: Mild emphysematous hyperinflation of the lungs without acute pulmonary disease. Electronically Signed   By: Ashley Royalty M.D.   On: 08/21/2016 21:34    Assessment: 75 year old gentleman presenting with acute onset melena, epigastric pain. Also with some more chronic left lower quadrant fullness/discomfort. CT imaging concerning for abnormality in the duodenum. Colonoscopy up to date, 2016.  Interestingly, patient states he had a remote EGD and had "varicose veins" noted. Details are not available, patient cannot recall who performed the procedure. No indication of cirrhosis or portal hypertension on imaging.   EGD completed 08/22/2016 which found normal esophagus, medium-sized hiatal hernia, a single nonbleeding duodenal ulcer with no  stigmata of bleeding status post gastric biopsies for H. pylori. Recommended no repeat endoscopy for surveillance, follow-up on pathology and avoid all NSAIDs. Outpatient follow-up in our office 3 months.  Today he is clinically improved. No s'/s of rebleed. Some mild abdominal discomfort remains. CBC this morning improved/stable at 12.8.   Plan: 1. Continued bid PPI x 3 months 2. Outpatient GI f/u in 3 months 3. Call our office with any questions/problems. [place] for d/c from GI standpoint   Thank you for allowing [place] to participate in the care of [place] Calix, DNP, AGNP-C Adult & Gerontological Nurse Practitioner [facility]     LOS: 0 days    08/23/2016, 3:04 PM

## 2016-08-26 ENCOUNTER — Encounter: Payer: Self-pay | Admitting: Internal Medicine

## 2016-08-29 ENCOUNTER — Telehealth: Payer: Self-pay

## 2016-08-29 ENCOUNTER — Encounter: Payer: Self-pay | Admitting: Internal Medicine

## 2016-08-29 ENCOUNTER — Other Ambulatory Visit: Payer: Self-pay

## 2016-08-29 DIAGNOSIS — K263 Acute duodenal ulcer without hemorrhage or perforation: Secondary | ICD-10-CM

## 2016-08-29 NOTE — Telephone Encounter (Signed)
Letter and lab order mailed to the pt.  Please schedule pt with LSL

## 2016-08-29 NOTE — Telephone Encounter (Signed)
OV made °

## 2016-08-29 NOTE — Telephone Encounter (Signed)
Per RMR-  Kenneth Dolin, MD  Claudina Lick, LPN; Theadora Rama        Send letter to patient.  Send copy of letter with path to referring provider and PCP. Need H. pylori serologies to double check HP status. Office visit with LSL in 3 months. Patient had a large DU. Depending on clinical progress, might consider a brief look in 3 months to make sure ulcer is gone. He should be discussed after his next office visit.

## 2016-09-22 DIAGNOSIS — I700 Atherosclerosis of aorta: Secondary | ICD-10-CM | POA: Diagnosis not present

## 2016-09-22 DIAGNOSIS — I2510 Atherosclerotic heart disease of native coronary artery without angina pectoris: Secondary | ICD-10-CM | POA: Diagnosis not present

## 2016-09-22 DIAGNOSIS — K263 Acute duodenal ulcer without hemorrhage or perforation: Secondary | ICD-10-CM | POA: Diagnosis not present

## 2016-11-05 ENCOUNTER — Other Ambulatory Visit: Payer: Self-pay | Admitting: Internal Medicine

## 2016-11-05 ENCOUNTER — Other Ambulatory Visit: Payer: Self-pay

## 2016-11-05 ENCOUNTER — Telehealth: Payer: Self-pay

## 2016-11-05 ENCOUNTER — Encounter: Payer: Self-pay | Admitting: Internal Medicine

## 2016-11-05 ENCOUNTER — Ambulatory Visit (INDEPENDENT_AMBULATORY_CARE_PROVIDER_SITE_OTHER): Payer: Medicare Other | Admitting: Internal Medicine

## 2016-11-05 VITALS — BP 148/72 | HR 61 | Temp 97.6°F | Ht 71.0 in | Wt 194.0 lb

## 2016-11-05 DIAGNOSIS — R197 Diarrhea, unspecified: Secondary | ICD-10-CM

## 2016-11-05 DIAGNOSIS — R109 Unspecified abdominal pain: Secondary | ICD-10-CM | POA: Diagnosis not present

## 2016-11-05 DIAGNOSIS — K529 Noninfective gastroenteritis and colitis, unspecified: Secondary | ICD-10-CM | POA: Diagnosis not present

## 2016-11-05 DIAGNOSIS — K279 Peptic ulcer, site unspecified, unspecified as acute or chronic, without hemorrhage or perforation: Secondary | ICD-10-CM | POA: Diagnosis not present

## 2016-11-05 NOTE — Patient Instructions (Signed)
Take protonix 40 mg twice daily ( new Rx - disp 60 with 11 refills)  Chem-20, TSH, CBC and hp serologies  Further recommendations to follow

## 2016-11-05 NOTE — Progress Notes (Addendum)
Primary Care Physician:  Gar Ponto, MD Primary Gastroenterologist:  Dr. Gala Romney  Pre-Procedure History & Physical: HPI:  Kenneth Santiago is a 76 y.o. male here for follow-up from recent hospitalization for upper GI bleeding related to do duodenal ulcer. He was scheduled to follow-up in March of this year; daughter called to move up appt;.  Continues to have diarrhea. Sometimes has dark stools. Daughter feels he's not been taking his PPI as directed; may have been out for good bit of time and at other times was taking 1 a day. A 12 mm (at least) deep bulbar ulcer crater that did not require any endoscopic therapy. Gastric biopsies - H pylori negative. Abnormality in this area seen on CT there was concern for a mass. Upon extensive review,  patient absolutely denies any formal nonsteroidal agent. I ordered H pylori serology several weeks ago but they've not yet been done. Patient concerned that his wrist circumference has become smaller which he says is indicative of him being  "low on calcium". Patient also complains about chronic diarrhea..  Daughter who accompanies him today feels that he may be suffering from early dementia. She corroborates no nonsteroidal agents in the house. She has significant concerns he's not been taking his medication as previously directed, however.  Years ago, he was told he had celiac disease based on blood work. We did a TTG and total IgA here through the office  -   they both came back normal/negative. Small bowel aside from ulcer looked normal on recent EGD; however, in thesetting of bleeding, small bowel was not biopsied.  Past Medical History:  Diagnosis Date  . Abdominal pain   . Actinic keratosis   . Basal cell carcinoma   . Celiac disease    ???   . Diabetes ([place])   . Diverticulosis   . Essential hypertension   . GERD (gastroesophageal reflux disease)   . Hyperlipidemia   . Hypothyroidism   . Major depression   . Metabolic syndrome   .  Osteoarthritis     Past Surgical History:  Procedure Laterality Date  . BACK SURGERY  1987  . BIOPSY N/A 05/25/2015   Procedure: BIOPSY;  Surgeon: Daneil Dolin, MD;  Location: [facility];  Service: Endoscopy;  Laterality: N/A;  right and descending/sigmoid colon  . BIOPSY  08/22/2016   Procedure: BIOPSY;  Surgeon: Daneil Dolin, MD;  Location: [facility] SU[facility];  Service: Endoscopy;;  gastric     . CATARACT EXTRACTION  1988  . CHOLECYSTECTOMY    . COLONOSCOPY     2007 at [facility]  . COLONOSCOPY WITH PROPOFOL N/A 05/25/2015   [id number] anal pipilla otherwise normal/pancolonic diverticulosis  . ESOPHAGOGASTRODUODENOSCOPY     2007 at [facility]  . ESOPHAGOGASTRODUODENOSCOPY (EGD) WITH PROPOFOL N/A 08/22/2016   Procedure: ESOPHAGOGASTRODUODENOSCOPY (EGD) WITH PROPOFOL;  Surgeon: Daneil Dolin, MD;  Location: [facility] SU[facility];  Service: Endoscopy;  Laterality: N/A;  . EYES SURGERY      Prior to Admission medications   Medication Sig Start Date End Date Taking? Authorizing Provider  dorzolamide-timolol (COSOPT) 22.3-6.8 MG/ML ophthalmic solution Place 1 drop into both eyes daily. 09/27/16  Yes Historical Provider, MD  latanoprost (XALATAN) 0.005 % ophthalmic solution 1 drop 2 (two) times daily. 08/21/16  Yes Historical Provider, MD  levothyroxine (SYNTHROID, LEVOTHROID) 125 MCG tablet Take 125 mcg by mouth daily before breakfast.    Yes Historical Provider, MD  losartan (COZAAR) 50 MG tablet 1 Tablet(s) PO daily 01/17/15  Yes Historical Provider, MD  OVER THE COUNTER MEDICATION Antidiarrheal as needed (unsure of name)   Yes Historical Provider, MD  pantoprazole (PROTONIX) 40 MG tablet Take 1 tablet (40 mg total) by mouth 2 (two) times daily. 08/23/16  Yes Erline Hau, MD  sertraline (ZOLOFT) 50 MG tablet take 1 tablet by mouth once daily for depression 01/17/15  Yes Historical Provider, MD    Allergies as of 11/05/2016 - Review Complete 11/05/2016  Allergen Reaction Noted  . Iodinated  diagnostic agents Rash 04/03/2015    Family History  Problem Relation Age of Onset  . CVA Father   . Dementia Mother   . Hyperlipidemia Sister   . Hypothyroidism Sister   . Colon cancer Neg Hx     Social History   Social History  . Marital status: Widowed    Spouse name: N/A  . Number of children: N/A  . Years of education: N/A   Occupational History  . Not on file.   Social History Main Topics  . Smoking status: Never Smoker  . Smokeless tobacco: Never Used  . Alcohol use No  . Drug use: No  . Sexual activity: Not on file   Other Topics Concern  . Not on file   Social History Narrative  . No narrative on file    Review of Systems: See HPI, otherwise negative ROS  Physical Exam: BP (!) 148/72   Pulse 61   Temp 97.6 F (36.4 C) (Oral)   Ht 5\' 11"  (1.803 m)   Wt 194 lb (88 kg)   BMI 27.06 kg/m  General:   Somewhat disheveled appearing bearded elderly gentleman accompanied by his daughter and grandson.  He appears to be in no acute distress. Neck:  Supple; no masses or thyromegaly. No significant cervical adenopathy. Lungs:  Clear throughout to auscultation.   No wheezes, crackles, or rhonchi. No acute distress. Heart:  Regular rate and rhythm; no murmurs, clicks, rubs,  or gallops. Abdomen: Non-distended, normal bowel sounds.  Soft and nontender without appreciable mass or hepatosplenomegaly.  Pulses:  Normal pulses noted. Extremities:  Without clubbing or edema.  Impression:  Pleasant 76 year old gentleman with recent hospitalization due to an upper GI bleed; he had a large duodenal ulcer -  did not did not require endoscopic treatment. H pylori negative by histology. Serologies not done as of yet. Chronic diarrhea. No evidence of celiac disease based on our serological workup. Somewhat suspicious looking ulcer on CT but appeared to be a benign duodenal ulcer by my endoscopic exam. This was a large ulcer. He adamantly denies NSAID use. This may be one of the  10-15% of DU's which are non-H pylori/NSAID related. It may be worth looking at his duodenum once again.   Recommendations:    Take protonix 40 mg twice daily ( new Rx - disp 60 with 11 refills)  Chem-20, TSH, CBC and hp serologies  Further recommendations to follow   I explained to patient and patient's daughter that depending on laboratory results, patient may need further imaging and/or repeat EGD in the near future.    Notice: This dictation was prepared with Dragon dictation along with smaller phrase technology. Any transcriptional errors that result from this process are unintentional and may not be corrected upon review.

## 2016-11-05 NOTE — Telephone Encounter (Signed)
I called the prescription per Dr.Rourk to the pharmacist, Eulas Post at [facility] in [place]. Protonix 40 mg bid #60 with 11 refills.

## 2016-11-05 NOTE — Telephone Encounter (Signed)
Labs were originally ordered STAT.  Felise called from [facility] to ask for contact number. Dr. Gala Romney said he does not need to know tonight, as long as the pt has blood work drawn now and he can get results tomorrow morning.   Lab ordered originally entered for [facility], but [facility] no longer does the H pylori serologies so pt was instructed to take lab orders to [facility].

## 2016-11-06 LAB — COMPREHENSIVE METABOLIC PANEL
ALT: 15 IU/L (ref 0–44)
AST: 15 IU/L (ref 0–40)
Albumin/Globulin Ratio: 1.4 (ref 1.2–2.2)
Albumin: 4.1 g/dL (ref 3.5–4.8)
Alkaline Phosphatase: 87 IU/L (ref 39–117)
BUN/Creatinine Ratio: 15 (ref 10–24)
BUN: 11 mg/dL (ref 8–27)
Bilirubin Total: 0.6 mg/dL (ref 0.0–1.2)
CO2: 27 mmol/L (ref 18–29)
CREATININE: 0.73 mg/dL — AB (ref 0.76–1.27)
Calcium: 9.2 mg/dL (ref 8.6–10.2)
Chloride: 100 mmol/L (ref 96–106)
GFR calc Af Amer: 106 mL/min/{1.73_m2} (ref >59)
GFR, EST NON AFRICAN AMERICAN: 92 mL/min/{1.73_m2} (ref >59)
Globulin, Total: 2.9 g/dL (ref 1.5–4.5)
Glucose: 248 mg/dL — ABNORMAL HIGH (ref 65–99)
Potassium: 4.5 mmol/L (ref 3.5–5.2)
Sodium: 140 mmol/L (ref 134–144)
Total Protein: 7 g/dL (ref 6.0–8.5)

## 2016-11-06 LAB — CBC/DIFF AMBIGUOUS DEFAULT
Basophils Absolute: 0 10*3/uL (ref 0.0–0.2)
Basos: 0 %
EOS (ABSOLUTE): 0.3 10*3/uL (ref 0.0–0.4)
EOS: 6 %
Hematocrit: 43.4 % (ref 37.5–51.0)
Hemoglobin: 14.9 g/dL (ref 13.0–17.7)
IMMATURE GRANS (ABS): 0 10*3/uL (ref 0.0–0.1)
IMMATURE GRANULOCYTES: 0 %
LYMPHS: 40 %
Lymphocytes Absolute: 2.3 10*3/uL (ref 0.7–3.1)
MCH: 29.7 pg (ref 26.6–33.0)
MCHC: 34.3 g/dL (ref 31.5–35.7)
MCV: 87 fL (ref 79–97)
MONOS ABS: 0.5 10*3/uL (ref 0.1–0.9)
Monocytes: 9 %
NEUTROS PCT: 45 %
Neutrophils Absolute: 2.5 10*3/uL (ref 1.4–7.0)
PLATELETS: 173 10*3/uL (ref 150–379)
RBC: 5.01 x10E6/uL (ref 4.14–5.80)
RDW: 14.1 % (ref 12.3–15.4)
WBC: 5.6 10*3/uL (ref 3.4–10.8)

## 2016-11-06 LAB — AMBIG ABBREV CMP14 DEFAULT

## 2016-11-06 LAB — H. PYLORI ANTIBODY, IGG: H. pylori, IgG AbS: <0.8 Index Value (ref 0.00–0.79)

## 2016-11-06 LAB — TSH: TSH: 5.82 u[IU]/mL — ABNORMAL HIGH (ref 0.450–4.500)

## 2016-11-06 NOTE — Telephone Encounter (Signed)
noted 

## 2016-11-07 ENCOUNTER — Telehealth: Payer: Self-pay

## 2016-11-07 NOTE — Telephone Encounter (Signed)
[facility] results in Dr. Roseanne Kaufman box.

## 2016-11-08 ENCOUNTER — Telehealth: Payer: Self-pay | Admitting: *Deleted

## 2016-11-08 NOTE — Telephone Encounter (Signed)
CBC completely normal;  blood sugar poorly controlled at 248; serum calcium is normal. No H pylori based on serologies. TSH of 5.82 which may mean a little hypothyroidism. Continue Protonix 40 mg twice daily. I'm glad he is doing better on that regimen.  Please send these labs to his PCP for further evaluation of the thyroid as deemed appropriate  No further testing right now. I recommend he return to see the extender in the office here in 4-6 weeks.

## 2016-11-08 NOTE — Telephone Encounter (Signed)
Tried to call the pt- NA-LMOM with results.  Kenneth Santiago, please cc results to pcp- they are on RMR desk Erline Levine, please schedule ov.

## 2016-11-08 NOTE — Telephone Encounter (Signed)
Patient called stating that he is doing good on the prescribed medication and also wants to know if the results are back from his lab work

## 2016-11-08 NOTE — Telephone Encounter (Signed)
Routing to RMR- Doris put the labs on your cart yesterday.

## 2016-11-11 ENCOUNTER — Encounter: Payer: Self-pay | Admitting: Internal Medicine

## 2016-11-11 NOTE — Telephone Encounter (Signed)
APPT MADE AND LETTER SENT  °

## 2016-11-20 ENCOUNTER — Encounter: Payer: Self-pay | Admitting: Gastroenterology

## 2016-11-20 DIAGNOSIS — Z961 Presence of intraocular lens: Secondary | ICD-10-CM | POA: Diagnosis not present

## 2016-11-20 DIAGNOSIS — H401133 Primary open-angle glaucoma, bilateral, severe stage: Secondary | ICD-10-CM | POA: Diagnosis not present

## 2016-11-25 ENCOUNTER — Ambulatory Visit: Payer: Medicare Other | Admitting: Gastroenterology

## 2016-11-28 DIAGNOSIS — E782 Mixed hyperlipidemia: Secondary | ICD-10-CM | POA: Diagnosis not present

## 2016-11-28 DIAGNOSIS — E039 Hypothyroidism, unspecified: Secondary | ICD-10-CM | POA: Diagnosis not present

## 2016-11-28 DIAGNOSIS — E1165 Type 2 diabetes mellitus with hyperglycemia: Secondary | ICD-10-CM | POA: Diagnosis not present

## 2016-12-11 ENCOUNTER — Ambulatory Visit (INDEPENDENT_AMBULATORY_CARE_PROVIDER_SITE_OTHER): Payer: Medicare Other | Admitting: Gastroenterology

## 2016-12-11 ENCOUNTER — Encounter (HOSPITAL_COMMUNITY): Payer: Self-pay

## 2016-12-11 ENCOUNTER — Encounter: Payer: Self-pay | Admitting: Gastroenterology

## 2016-12-11 ENCOUNTER — Other Ambulatory Visit: Payer: Self-pay

## 2016-12-11 ENCOUNTER — Encounter (HOSPITAL_COMMUNITY)
Admission: RE | Admit: 2016-12-11 | Discharge: 2016-12-11 | Disposition: A | Discharge status: Home / Self Care | Payer: Medicare Other | Source: Ambulatory Visit | Attending: Internal Medicine | Admitting: Internal Medicine

## 2016-12-11 VITALS — BP 159/79 | HR 67 | Temp 97.9°F | Ht 71.0 in | Wt 183.6 lb

## 2016-12-11 DIAGNOSIS — Z8249 Family history of ischemic heart disease and other diseases of the circulatory system: Secondary | ICD-10-CM | POA: Diagnosis not present

## 2016-12-11 DIAGNOSIS — K921 Melena: Secondary | ICD-10-CM | POA: Diagnosis not present

## 2016-12-11 DIAGNOSIS — Z8349 Family history of other endocrine, nutritional and metabolic diseases: Secondary | ICD-10-CM | POA: Diagnosis not present

## 2016-12-11 DIAGNOSIS — I10 Essential (primary) hypertension: Secondary | ICD-10-CM | POA: Diagnosis not present

## 2016-12-11 DIAGNOSIS — K922 Gastrointestinal hemorrhage, unspecified: Secondary | ICD-10-CM

## 2016-12-11 DIAGNOSIS — H409 Unspecified glaucoma: Secondary | ICD-10-CM | POA: Diagnosis not present

## 2016-12-11 DIAGNOSIS — K219 Gastro-esophageal reflux disease without esophagitis: Secondary | ICD-10-CM | POA: Diagnosis not present

## 2016-12-11 DIAGNOSIS — M1990 Unspecified osteoarthritis, unspecified site: Secondary | ICD-10-CM | POA: Diagnosis not present

## 2016-12-11 DIAGNOSIS — K529 Noninfective gastroenteritis and colitis, unspecified: Secondary | ICD-10-CM | POA: Diagnosis not present

## 2016-12-11 DIAGNOSIS — E785 Hyperlipidemia, unspecified: Secondary | ICD-10-CM | POA: Diagnosis not present

## 2016-12-11 DIAGNOSIS — Z85828 Personal history of other malignant neoplasm of skin: Secondary | ICD-10-CM | POA: Diagnosis not present

## 2016-12-11 DIAGNOSIS — K449 Diaphragmatic hernia without obstruction or gangrene: Secondary | ICD-10-CM | POA: Diagnosis not present

## 2016-12-11 DIAGNOSIS — E119 Type 2 diabetes mellitus without complications: Secondary | ICD-10-CM | POA: Diagnosis not present

## 2016-12-11 DIAGNOSIS — R634 Abnormal weight loss: Secondary | ICD-10-CM | POA: Insufficient documentation

## 2016-12-11 DIAGNOSIS — Z9841 Cataract extraction status, right eye: Secondary | ICD-10-CM | POA: Diagnosis not present

## 2016-12-11 DIAGNOSIS — Z820 Family history of epilepsy and other diseases of the nervous system: Secondary | ICD-10-CM | POA: Diagnosis not present

## 2016-12-11 DIAGNOSIS — Z9049 Acquired absence of other specified parts of digestive tract: Secondary | ICD-10-CM | POA: Diagnosis not present

## 2016-12-11 DIAGNOSIS — K269 Duodenal ulcer, unspecified as acute or chronic, without hemorrhage or perforation: Secondary | ICD-10-CM

## 2016-12-11 DIAGNOSIS — E8881 Metabolic syndrome: Secondary | ICD-10-CM | POA: Diagnosis not present

## 2016-12-11 DIAGNOSIS — F329 Major depressive disorder, single episode, unspecified: Secondary | ICD-10-CM | POA: Diagnosis not present

## 2016-12-11 DIAGNOSIS — E039 Hypothyroidism, unspecified: Secondary | ICD-10-CM | POA: Diagnosis not present

## 2016-12-11 DIAGNOSIS — K315 Obstruction of duodenum: Secondary | ICD-10-CM | POA: Diagnosis not present

## 2016-12-11 DIAGNOSIS — Z823 Family history of stroke: Secondary | ICD-10-CM | POA: Diagnosis not present

## 2016-12-11 DIAGNOSIS — Z9842 Cataract extraction status, left eye: Secondary | ICD-10-CM | POA: Diagnosis not present

## 2016-12-11 DIAGNOSIS — R197 Diarrhea, unspecified: Secondary | ICD-10-CM | POA: Diagnosis not present

## 2016-12-11 DIAGNOSIS — Z8719 Personal history of other diseases of the digestive system: Secondary | ICD-10-CM

## 2016-12-11 DIAGNOSIS — Z8711 Personal history of peptic ulcer disease: Secondary | ICD-10-CM | POA: Diagnosis not present

## 2016-12-11 HISTORY — DX: Polyneuropathy, unspecified: G62.9

## 2016-12-11 HISTORY — DX: Unspecified glaucoma: H40.9

## 2016-12-11 LAB — CBC WITH DIFFERENTIAL/PLATELET
BASOS ABS: 0.1 10*3/uL (ref 0.0–0.1)
BASOS PCT: 1 %
Eosinophils Absolute: 0.2 10*3/uL (ref 0.0–0.7)
Eosinophils Relative: 3 %
HEMATOCRIT: 43.3 % (ref 39.0–52.0)
HEMOGLOBIN: 15.3 g/dL (ref 13.0–17.0)
LYMPHS PCT: 35 %
Lymphs Abs: 2.1 10*3/uL (ref 0.7–4.0)
MCH: 30.1 pg (ref 26.0–34.0)
MCHC: 35.3 g/dL (ref 30.0–36.0)
MCV: 85.1 fL (ref 78.0–100.0)
MONO ABS: 0.6 10*3/uL (ref 0.1–1.0)
Monocytes Relative: 10 %
NEUTROS ABS: 3.1 10*3/uL (ref 1.7–7.7)
NEUTROS PCT: 51 %
Platelets: 177 10*3/uL (ref 150–400)
RBC: 5.09 MIL/uL (ref 4.22–5.81)
RDW: 14.5 % (ref 11.5–15.5)
WBC: 6 10*3/uL (ref 4.0–10.5)

## 2016-12-11 LAB — COMPREHENSIVE METABOLIC PANEL
ALBUMIN: 4.2 g/dL (ref 3.5–5.0)
ALT: 21 U/L (ref 17–63)
AST: 21 U/L (ref 15–41)
Alkaline Phosphatase: 76 U/L (ref 38–126)
Anion gap: 7 (ref 5–15)
BILIRUBIN TOTAL: 1 mg/dL (ref 0.3–1.2)
BUN: 11 mg/dL (ref 6–20)
CHLORIDE: 103 mmol/L (ref 101–111)
CO2: 26 mmol/L (ref 22–32)
CREATININE: 0.86 mg/dL (ref 0.61–1.24)
Calcium: 9.4 mg/dL (ref 8.9–10.3)
GFR calc Af Amer: >60 mL/min (ref >60)
GFR calc non Af Amer: >60 mL/min (ref >60)
GLUCOSE: 127 mg/dL — AB (ref 65–99)
Potassium: 3.4 mmol/L — ABNORMAL LOW (ref 3.5–5.1)
Sodium: 136 mmol/L (ref 135–145)
TOTAL PROTEIN: 7.4 g/dL (ref 6.5–8.1)

## 2016-12-11 NOTE — Patient Instructions (Signed)
Submitted PA info for EGD online via [facility] website. No PA needed. Decision ID# [id number].

## 2016-12-11 NOTE — Progress Notes (Signed)
Minimal hypokalemia. Would recommend KCL 25meq bid X 4 doses. No refills.  Await other labs.

## 2016-12-11 NOTE — Progress Notes (Signed)
cc'ed to pcp °

## 2016-12-11 NOTE — Progress Notes (Signed)
Primary Care Physician: Gar Ponto, MD  Primary Gastroenterologist:  Garfield Cornea, MD   Chief Complaint  Patient presents with  . Ulcer    HPI: Kenneth Santiago is a 76 y.o. male here for follow up. Last seen in 10/2016. H/O recent hospitalization for UGI bleeding related to duodenal ulcer (07/2016).  A 12 mm (at least) deep bulbar ulcer crater that did not require any endoscopic therapy. Gastric biopsies - H pylori negative. Abnormality in this area seen on CT there was concern for a mass. H.pylori serologies recently negative. No NSAID use. Remote h/o celiac disase but TTG/total IgA normal (2016). Small bowel not biopsied recently in setting of bleeding. Colonoscopy in 05/2015, normal ileocolonoscopy and random colon bx negative. Procedure done for diarrhea.   Patient had normal labs recently last month with exception of TSH slightly elevated and glucose over 200. Reported h/o diabetes requiring medication in past, but he has been off for years and doesn't check his sugars. Complains of slime and mucous in stools. Started wearing depends at night. Will have 3-4 loose stools back to back, this will happen several times a day and wakes him up from sleep. Weight down 10 pounds in the past one month. Doesn't matter what eats still has diarrhea. No longer maintains gluten free diet. Intermittent vomiting, after meals. Few days ago, vomit coffee grounds and stool black. Daughter present today brought hand written note voicing her concerns of fecal incontinence, hematemesis, melena, weight loss, confusion. Patient repeated his history of recent melena at least 3 times during the visit today.   He takes Kaopectate frequently. His stomach "has been messed up as long as I can remember". "I have bought cases of Kaopectate at a time in the past".   Also complains of left lower abdominal pain, ruq pain.   h/o celiac disease diagnosed years ago by serologies ordered by PCP. TTG, IGA and IGA level  normal couple of years ago.   Daughter reports patient eats dinner with them 5 days a week. At other times, he has canned vegetables, pork n beans, dry cereal/juice.     Current Outpatient Prescriptions  Medication Sig Dispense Refill  . dorzolamide-timolol (COSOPT) 22.3-6.8 MG/ML ophthalmic solution Place 1 drop into both eyes daily.    Marland Kitchen latanoprost (XALATAN) 0.005 % ophthalmic solution 1 drop 2 (two) times daily.    Marland Kitchen levothyroxine (SYNTHROID, LEVOTHROID) 125 MCG tablet Take 175 mcg by mouth daily before breakfast.     . losartan (COZAAR) 50 MG tablet 1 Tablet(s) PO daily  0  . OVER THE COUNTER MEDICATION Antidiarrheal as needed (unsure of name)    . pantoprazole (PROTONIX) 40 MG tablet Take 1 tablet (40 mg total) by mouth 2 (two) times daily. 60 tablet 2  . sertraline (ZOLOFT) 50 MG tablet take 1 tablet by mouth BID for depression  0   No current facility-administered medications for this visit.     Allergies as of 12/11/2016 - Review Complete 12/11/2016  Allergen Reaction Noted  . Iodinated diagnostic agents Rash 04/03/2015   Past Medical History:  Diagnosis Date  . Abdominal pain   . Actinic keratosis   . Basal cell carcinoma    on face  . Celiac disease    ??? reported h/o positive serologies by PCP remotely  . Diabetes ([place])   . Diverticulosis   . Essential hypertension   . GERD (gastroesophageal reflux disease)   . Glaucoma   . Hyperlipidemia   . Hypothyroidism   .  Major depression   . Metabolic syndrome   . Osteoarthritis    Past Surgical History:  Procedure Laterality Date  . BACK SURGERY  1987  . BIOPSY N/A 05/25/2015   Procedure: BIOPSY;  Surgeon: Daneil Dolin, MD;  Location: [facility] ORS;  Service: Endoscopy;  Laterality: N/A;  right and descending/sigmoid colon  . BIOPSY  08/22/2016   Procedure: BIOPSY;  Surgeon: Daneil Dolin, MD;  Location: [facility] END[facility] SUITE;  Service: Endoscopy;;  gastric     . CATARACT EXTRACTION Bilateral 1988  . CHOLECYSTECTOMY    .  COLONOSCOPY     2007 at [facility]  . COLONOSCOPY WITH PROPOFOL N/A 05/25/2015   WRU:EAVWUJ anal pipilla otherwise normal/pancolonic diverticulosis  . ESOPHAGOGASTRODUODENOSCOPY     2007 at [facility]  . ESOPHAGOGASTRODUODENOSCOPY (EGD) WITH PROPOFOL N/A 08/22/2016   Procedure: ESOPHAGOGASTRODUODENOSCOPY (EGD) WITH PROPOFOL;  Surgeon: Daneil Dolin, MD;  Location: [facility];  Service: Endoscopy;  Laterality: N/A;  . EYES SURGERY Bilateral    retinal detatchment x2   Family History  Problem Relation Age of Onset  . CVA Father   . Dementia Mother   . Hyperlipidemia Sister   . Hypothyroidism Sister   . Colon cancer Neg Hx    Social History   Social History  . Marital status: Widowed    Spouse name: N/A  . Number of children: N/A  . Years of education: N/A   Social History Main Topics  . Smoking status: Never Smoker  . Smokeless tobacco: Never Used  . Alcohol use No  . Drug use: No  . Sexual activity: Not Asked   Other Topics Concern  . None   Social History Narrative  . None    ROS:  General: Negative for anorexia,   fever, chills, fatigue,++weakness. ++ weight loss.  ENT: Negative for hoarseness, difficulty swallowing , nasal congestion. CV: Negative for chest pain, angina, palpitations, dyspnea on exertion, peripheral edema.  Respiratory: Negative for dyspnea at rest, dyspnea on exertion, cough, sputum, wheezing.  GI: See history of present illness. GU:  Negative for dysuria, hematuria, urinary incontinence, urinary frequency, nocturnal urination.  Endo: see hpi   Physical Examination:   BP (!) 159/79   Pulse 67   Temp 97.9 F (36.6 C) (Oral)   Ht 5\' 11"  (1.803 m)   Wt 183 lb 9.6 oz (83.3 kg)   BMI 25.61 kg/m   General: bearded, wm, accompanied by dgt. NAD. Repeated his history few times regarding melena.  Eyes: No icterus. Mouth: Oropharyngeal mucosa moist and pink , no lesions erythema or exudate. Lungs: Clear to auscultation bilaterally.  Heart:  Regular rate and rhythm, no murmurs rubs or gallops.  Abdomen: Bowel sounds are normal, ruq/epig tenderness with palpation, milder more diffuse tenderness noted.  nondistended, no hepatosplenomegaly or masses, no abdominal bruits or hernia , no rebound or guarding.   Extremities: No lower extremity edema. No clubbing or deformities. Neuro: Alert and oriented x 4   Skin: Warm and dry, no jaundice.   Psych: Alert and cooperative, normal mood and affect.  Labs:  Lab Results  Component Value Date   WBC 5.6 11/05/2016   HGB 14.9 11/05/2016   HCT 43.4 11/05/2016   MCV 87 11/05/2016   PLT 173 11/05/2016   Lab Results  Component Value Date   TSH 5.820 (H) 11/05/2016   Lab Results  Component Value Date   CREATININE 0.73 (L) 11/05/2016   BUN 11 11/05/2016   NA 140 11/05/2016   K  4.5 11/05/2016   CL 100 11/05/2016   CO2 27 11/05/2016   Lab Results  Component Value Date   ALT 15 11/05/2016   AST 15 11/05/2016   ALKPHOS 87 11/05/2016   BILITOT 0.6 11/05/2016    Imaging Studies: No results found.   Impression/Plan:  76 y/o male with h/o UGI bleeding in 07/2016 due to large duodenal ulcer, gastric bx neg for H.pylori, recent serologies negative as well, who presents for further evaluation of weight loss, coffee ground emesis/melena (with h/o Kaopectate use), persistent chronic diarrhea with prior reported positive celiac serologies remotely. No reported NSAID use. He is on pantoprazole BID. He is having significant stool frequency and urgency even nocturnally.   Multiple other issues of h/o diabetes no longer on meds, recent glucose over 200.   Will repeat labs today given recent reported melena, also obtain celiac panel (reports frequent gluten exposure), HgbA1C. Plan on EGD in OR (failed conscious sedation in the past) as soon as possible.  I have discussed the risks, alternatives, benefits with regards to but not limited to the risk of reaction to medication, bleeding, infection,  perforation and the patient is agreeable to proceed. Written consent to be obtained. Consider small bowel biopsy to rule out celiac disease.   Currently colonoscopy is up to date, 2016 for diarrhea and normal including random colon bx and terminal ileum appeared normal.

## 2016-12-11 NOTE — Patient Instructions (Signed)
Kenneth Santiago  12/11/2016     @PREFPERIOPPHARMACY @   Your procedure is scheduled on  12/12/2016   Report to [facility] at  745  A.M.  Call this number if you have problems the morning of surgery:  [phone]   Remember:  Do not eat food or drink liquids after midnight.  Take these medicines the morning of surgery with A SIP OF WATER  Levothyroxine, cozaar, protonix, zoloft.   Do not wear jewelry, make-up or nail polish.  Do not wear lotions, powders, or perfumes, or deoderant.  Do not shave 48 hours prior to surgery.  Men may shave face and neck.  Do not bring valuables to the hospital.  [facility] is not responsible for any belongings or valuables.  Contacts, dentures or bridgework may not be worn into surgery.  Leave your suitcase in the car.  After surgery it may be brought to your room.  For patients admitted to the hospital, discharge time will be determined by your treatment team.  Patients discharged the day of surgery will not be allowed to drive home.   Name and phone number of your driver:   family Special instructions:  Follow the diet instructions given to you by Dr Roseanne Kaufman office.  Please read over the following fact sheets that you were given. Anesthesia Post-op Instructions and Care and Recovery After Surgery       Esophagogastroduodenoscopy Esophagogastroduodenoscopy (EGD) is a procedure to examine the lining of the esophagus, stomach, and first part of the small intestine (duodenum). This procedure is done to check for problems such as inflammation, bleeding, ulcers, or growths. During this procedure, a long, flexible, lighted tube with a camera attached (endoscope) is inserted down the throat. Tell a health care provider about:  Any allergies you have.  All medicines you are taking, including vitamins, herbs, eye drops, creams, and over-the-counter medicines.  Any problems you or family members have had with anesthetic  medicines.  Any blood disorders you have.  Any surgeries you have had.  Any medical conditions you have.  Whether you are pregnant or may be pregnant. What are the risks? Generally, this is a safe procedure. However, problems may occur, including:  Infection.  Bleeding.  A tear (perforation) in the esophagus, stomach, or duodenum.  Trouble breathing.  Excessive sweating.  Spasms of the larynx.  A slowed heartbeat.  Low blood pressure. What happens before the procedure?  Follow instructions from your health care provider about eating or drinking restrictions.  Ask your health care provider about:  Changing or stopping your regular medicines. This is especially important if you are taking diabetes medicines or blood thinners.  Taking medicines such as aspirin and ibuprofen. These medicines can thin your blood. Do not take these medicines before your procedure if your health care provider instructs you not to.  Plan to have someone take you home after the procedure.  If you wear dentures, be ready to remove them before the procedure. What happens during the procedure?  To reduce your risk of infection, your health care team will wash or sanitize their hands.  An IV tube will be put in a vein in your hand or arm. You will get medicines and fluids through this tube.  You will be given one or more of the following:  A medicine to help you relax (sedative).  A medicine to numb the area (local anesthetic). This medicine may be sprayed into  your throat. It will make you feel more comfortable and keep you from gagging or coughing during the procedure.  A medicine for pain.  A mouth guard may be placed in your mouth to protect your teeth and to keep you from biting on the endoscope.  You will be asked to lie on your left side.  The endoscope will be lowered down your throat into your esophagus, stomach, and duodenum.  Air will be put into the endoscope. This will help  your health care provider see better.  The lining of your esophagus, stomach, and duodenum will be examined.  Your health care provider may:  Take a tissue sample so it can be looked at in a lab (biopsy).  Remove growths.  Remove objects (foreign bodies) that are stuck.  Treat any bleeding with medicines or other devices that stop tissue from bleeding.  Widen (dilate) or stretch narrowed areas of your esophagus and stomach.  The endoscope will be taken out. The procedure may vary among health care providers and hospitals. What happens after the procedure?  Your blood pressure, heart rate, breathing rate, and blood oxygen level will be monitored often until the medicines you were given have worn off.  Do not eat or drink anything until the numbing medicine has worn off and your gag reflex has returned. This information is not intended to replace advice given to you by your health care provider. Make sure you discuss any questions you have with your health care provider. Document Released: 01/10/2005 Document Revised: 02/15/2016 Document Reviewed: 08/03/2015 Elsevier Interactive Patient Education  2017 [place]. Esophagogastroduodenoscopy, Care After Refer to this sheet in the next few weeks. These instructions provide you with information about caring for yourself after your procedure. Your health care provider may also give you more specific instructions. Your treatment has been planned according to current medical practices, but problems sometimes occur. Call your health care provider if you have any problems or questions after your procedure. What can I expect after the procedure? After the procedure, it is common to have:  A sore throat.  Nausea.  Bloating.  Dizziness.  Fatigue. Follow these instructions at home:  Do not eat or drink anything until the numbing medicine (local anesthetic) has worn off and your gag reflex has returned. You will know that the local  anesthetic has worn off when you can swallow comfortably.  Do not drive for 24 hours if you received a medicine to help you relax (sedative).  If your health care provider took a tissue sample for testing during the procedure, make sure to get your test results. This is your responsibility. Ask your health care provider or the department performing the test when your results will be ready.  Keep all follow-up visits as told by your health care provider. This is important. Contact a health care provider if:  You cannot stop coughing.  You are not urinating.  You are urinating less than usual. Get help right away if:  You have trouble swallowing.  You cannot eat or drink.  You have throat or chest pain that gets worse.  You are dizzy or light-headed.  You faint.  You have nausea or vomiting.  You have chills.  You have a fever.  You have severe abdominal pain.  You have black, tarry, or bloody stools. This information is not intended to replace advice given to you by your health care provider. Make sure you discuss any questions you have with your health care provider.  Document Released: 08/26/2012 Document Revised: 02/15/2016 Document Reviewed: 08/03/2015 Elsevier Interactive Patient Education  2017 [place] Anesthesia is a term that refers to techniques, procedures, and medicines that help a person stay safe and comfortable during a medical procedure. Monitored anesthesia care, or sedation, is one type of anesthesia. Your anesthesia specialist may recommend sedation if you will be having a procedure that does not require you to be unconscious, such as:  Cataract surgery.  A dental procedure.  A biopsy.  A colonoscopy. During the procedure, you may receive a medicine to help you relax (sedative). There are three levels of sedation:  Mild sedation. At this level, you may feel awake and relaxed. You will be able to follow  directions.  Moderate sedation. At this level, you will be sleepy. You may not remember the procedure.  Deep sedation. At this level, you will be asleep. You will not remember the procedure. The more medicine you are given, the deeper your level of sedation will be. Depending on how you respond to the procedure, the anesthesia specialist may change your level of sedation or the type of anesthesia to fit your needs. An anesthesia specialist will monitor you closely during the procedure. Let your health care provider know about:  Any allergies you have.  All medicines you are taking, including vitamins, herbs, eye drops, creams, and over-the-counter medicines.  Any use of steroids (by mouth or as a cream).  Any problems you or family members have had with sedatives and anesthetic medicines.  Any blood disorders you have.  Any surgeries you have had.  Any medical conditions you have, such as sleep apnea.  Whether you are pregnant or may be pregnant.  Any use of cigarettes, alcohol, or street drugs. What are the risks? Generally, this is a safe procedure. However, problems may occur, including:  Getting too much medicine (oversedation).  Nausea.  Allergic reaction to medicines.  Trouble breathing. If this happens, a breathing tube may be used to help with breathing. It will be removed when you are awake and breathing on your own.  Heart trouble.  Lung trouble. Before the procedure Staying hydrated  Follow instructions from your health care provider about hydration, which may include:  Up to 2 hours before the procedure - you may continue to drink clear liquids, such as water, clear fruit juice, black coffee, and plain tea. Eating and drinking restrictions  Follow instructions from your health care provider about eating and drinking, which may include:  8 hours before the procedure - stop eating heavy meals or foods such as meat, fried foods, or fatty foods.  6 hours  before the procedure - stop eating light meals or foods, such as toast or cereal.  6 hours before the procedure - stop drinking milk or drinks that contain milk.  2 hours before the procedure - stop drinking clear liquids. Medicines  Ask your health care provider about:  Changing or stopping your regular medicines. This is especially important if you are taking diabetes medicines or blood thinners.  Taking medicines such as aspirin and ibuprofen. These medicines can thin your blood. Do not take these medicines before your procedure if your health care provider instructs you not to. Tests and exams  You will have a physical exam.  You may have blood tests done to show:  How well your kidneys and liver are working.  How well your blood can clot.  General instructions  Plan to have someone take you  home from the hospital or clinic.  If you will be going home right after the procedure, plan to have someone with you for 24 hours. What happens during the procedure?  Your blood pressure, heart rate, breathing, level of pain and overall condition will be monitored.  An IV tube will be inserted into one of your veins.  Your anesthesia specialist will give you medicines as needed to keep you comfortable during the procedure. This may mean changing the level of sedation.  The procedure will be performed. After the procedure  Your blood pressure, heart rate, breathing rate, and blood oxygen level will be monitored until the medicines you were given have worn off.  Do not drive for 24 hours if you received a sedative.  You may:  Feel sleepy, clumsy, or nauseous.  Feel forgetful about what happened after the procedure.  Have a sore throat if you had a breathing tube during the procedure.  Vomit. This information is not intended to replace advice given to you by your health care provider. Make sure you discuss any questions you have with your health care provider. Document  Released: 06/05/2005 Document Revised: 02/16/2016 Document Reviewed: 12/31/2015 Elsevier Interactive Patient Education  2017 [place], Care After These instructions provide you with information about caring for yourself after your procedure. Your health care provider may also give you more specific instructions. Your treatment has been planned according to current medical practices, but problems sometimes occur. Call your health care provider if you have any problems or questions after your procedure. What can I expect after the procedure? After your procedure, it is common to:  Feel sleepy for several hours.  Feel clumsy and have poor balance for several hours.  Feel forgetful about what happened after the procedure.  Have poor judgment for several hours.  Feel nauseous or vomit.  Have a sore throat if you had a breathing tube during the procedure. Follow these instructions at home: For at least 24 hours after the procedure:    Do not:  Participate in activities in which you could fall or become injured.  Drive.  Use heavy machinery.  Drink alcohol.  Take sleeping pills or medicines that cause drowsiness.  Make important decisions or sign legal documents.  Take care of children on your own.  Rest. Eating and drinking   Follow the diet that is recommended by your health care provider.  If you vomit, drink water, juice, or soup when you can drink without vomiting.  Make sure you have little or no nausea before eating solid foods. General instructions   Have a responsible adult stay with you until you are awake and alert.  Take over-the-counter and prescription medicines only as told by your health care provider.  If you smoke, do not smoke without supervision.  Keep all follow-up visits as told by your health care provider. This is important. Contact a health care provider if:  You keep feeling nauseous or you keep  vomiting.  You feel light-headed.  You develop a rash.  You have a fever. Get help right away if:  You have trouble breathing. This information is not intended to replace advice given to you by your health care provider. Make sure you discuss any questions you have with your health care provider. Document Released: 12/31/2015 Document Revised: 05/01/2016 Document Reviewed: 12/31/2015 Elsevier Interactive Patient Education  2017 [employer].

## 2016-12-11 NOTE — Patient Instructions (Addendum)
1. Please have your labs done today. We will contact you when results are back. 2. Upper endoscopy with Dr. Gala Romney. See separate instructions.  3. If you have progressive weakness, lightheadedness, further black sticky stools, go to nearest ER.  4. Continue pantoprazole twice daily.

## 2016-12-12 ENCOUNTER — Ambulatory Visit (HOSPITAL_COMMUNITY)
Admission: RE | Admit: 2016-12-12 | Discharge: 2016-12-12 | Disposition: A | Discharge status: Home / Self Care | Payer: Medicare Other | Source: Ambulatory Visit | Attending: Internal Medicine | Admitting: Internal Medicine

## 2016-12-12 ENCOUNTER — Encounter (HOSPITAL_COMMUNITY)
Admission: RE | Disposition: A | Discharge status: Home / Self Care | Payer: Self-pay | Source: Ambulatory Visit | Attending: Internal Medicine

## 2016-12-12 ENCOUNTER — Ambulatory Visit (HOSPITAL_COMMUNITY): Payer: Medicare Other | Admitting: Anesthesiology

## 2016-12-12 ENCOUNTER — Other Ambulatory Visit: Payer: Self-pay

## 2016-12-12 ENCOUNTER — Telehealth: Payer: Self-pay

## 2016-12-12 ENCOUNTER — Encounter (HOSPITAL_COMMUNITY): Payer: Self-pay | Admitting: Anesthesiology

## 2016-12-12 DIAGNOSIS — Z8711 Personal history of peptic ulcer disease: Secondary | ICD-10-CM | POA: Diagnosis not present

## 2016-12-12 DIAGNOSIS — E785 Hyperlipidemia, unspecified: Secondary | ICD-10-CM | POA: Diagnosis not present

## 2016-12-12 DIAGNOSIS — K274 Chronic or unspecified peptic ulcer, site unspecified, with hemorrhage: Secondary | ICD-10-CM | POA: Diagnosis not present

## 2016-12-12 DIAGNOSIS — Z9049 Acquired absence of other specified parts of digestive tract: Secondary | ICD-10-CM | POA: Diagnosis not present

## 2016-12-12 DIAGNOSIS — R197 Diarrhea, unspecified: Secondary | ICD-10-CM | POA: Diagnosis not present

## 2016-12-12 DIAGNOSIS — M1990 Unspecified osteoarthritis, unspecified site: Secondary | ICD-10-CM | POA: Diagnosis not present

## 2016-12-12 DIAGNOSIS — E8881 Metabolic syndrome: Secondary | ICD-10-CM | POA: Insufficient documentation

## 2016-12-12 DIAGNOSIS — K219 Gastro-esophageal reflux disease without esophagitis: Secondary | ICD-10-CM | POA: Insufficient documentation

## 2016-12-12 DIAGNOSIS — Z820 Family history of epilepsy and other diseases of the nervous system: Secondary | ICD-10-CM | POA: Diagnosis not present

## 2016-12-12 DIAGNOSIS — K449 Diaphragmatic hernia without obstruction or gangrene: Secondary | ICD-10-CM | POA: Insufficient documentation

## 2016-12-12 DIAGNOSIS — K922 Gastrointestinal hemorrhage, unspecified: Secondary | ICD-10-CM

## 2016-12-12 DIAGNOSIS — E039 Hypothyroidism, unspecified: Secondary | ICD-10-CM | POA: Insufficient documentation

## 2016-12-12 DIAGNOSIS — Z8249 Family history of ischemic heart disease and other diseases of the circulatory system: Secondary | ICD-10-CM | POA: Diagnosis not present

## 2016-12-12 DIAGNOSIS — Z9841 Cataract extraction status, right eye: Secondary | ICD-10-CM | POA: Diagnosis not present

## 2016-12-12 DIAGNOSIS — Z9842 Cataract extraction status, left eye: Secondary | ICD-10-CM | POA: Diagnosis not present

## 2016-12-12 DIAGNOSIS — Z8719 Personal history of other diseases of the digestive system: Secondary | ICD-10-CM

## 2016-12-12 DIAGNOSIS — H409 Unspecified glaucoma: Secondary | ICD-10-CM | POA: Insufficient documentation

## 2016-12-12 DIAGNOSIS — F329 Major depressive disorder, single episode, unspecified: Secondary | ICD-10-CM | POA: Insufficient documentation

## 2016-12-12 DIAGNOSIS — K3189 Other diseases of stomach and duodenum: Secondary | ICD-10-CM | POA: Diagnosis not present

## 2016-12-12 DIAGNOSIS — E119 Type 2 diabetes mellitus without complications: Secondary | ICD-10-CM | POA: Insufficient documentation

## 2016-12-12 DIAGNOSIS — Z85828 Personal history of other malignant neoplasm of skin: Secondary | ICD-10-CM | POA: Insufficient documentation

## 2016-12-12 DIAGNOSIS — I10 Essential (primary) hypertension: Secondary | ICD-10-CM | POA: Insufficient documentation

## 2016-12-12 DIAGNOSIS — K921 Melena: Secondary | ICD-10-CM | POA: Diagnosis not present

## 2016-12-12 DIAGNOSIS — Z8349 Family history of other endocrine, nutritional and metabolic diseases: Secondary | ICD-10-CM | POA: Diagnosis not present

## 2016-12-12 DIAGNOSIS — K315 Obstruction of duodenum: Secondary | ICD-10-CM | POA: Diagnosis not present

## 2016-12-12 DIAGNOSIS — Z823 Family history of stroke: Secondary | ICD-10-CM | POA: Insufficient documentation

## 2016-12-12 HISTORY — PX: ESOPHAGOGASTRODUODENOSCOPY (EGD) WITH PROPOFOL: SHX5813

## 2016-12-12 LAB — GLIADIN ANTIBODIES, SERUM
Gliadin IgA: 8 units (ref 0–19)
Gliadin IgG: 3 units (ref 0–19)

## 2016-12-12 LAB — HEMOGLOBIN A1C
Hgb A1c MFr Bld: 7.4 % — ABNORMAL HIGH (ref 4.8–5.6)
Mean Plasma Glucose: 166 mg/dL

## 2016-12-12 LAB — TISSUE TRANSGLUTAMINASE, IGA: Tissue Transglutaminase Ab, IgA: <2 U/mL (ref 0–3)

## 2016-12-12 LAB — GLUCOSE, CAPILLARY
GLUCOSE-CAPILLARY: 135 mg/dL — AB (ref 65–99)
Glucose-Capillary: 136 mg/dL — ABNORMAL HIGH (ref 65–99)

## 2016-12-12 SURGERY — ESOPHAGOGASTRODUODENOSCOPY (EGD) WITH PROPOFOL
Anesthesia: Monitor Anesthesia Care

## 2016-12-12 MED ORDER — MIDAZOLAM HCL 2 MG/2ML IJ SOLN
INTRAMUSCULAR | Status: AC
  Filled 2016-12-12: qty 2

## 2016-12-12 MED ORDER — LIDOCAINE VISCOUS 2 % MT SOLN
5.0000 mL | Freq: Two times a day (BID) | OROMUCOSAL | Status: DC
Start: 2016-12-12 — End: 2016-12-12
  Administered 2016-12-12 (×2): 5 mL via OROMUCOSAL

## 2016-12-12 MED ORDER — LIDOCAINE VISCOUS 2 % MT SOLN
OROMUCOSAL | Status: AC
  Filled 2016-12-12: qty 15

## 2016-12-12 MED ORDER — SIMETHICONE 40 MG/0.6ML PO SUSP
ORAL | Status: AC
  Filled 2016-12-12: qty 0.6

## 2016-12-12 MED ORDER — CHLORHEXIDINE GLUCONATE CLOTH 2 % EX PADS: 6.0000 | MEDICATED_PAD | Freq: Once | CUTANEOUS | Status: DC

## 2016-12-12 MED ORDER — EPHEDRINE SULFATE 50 MG/ML IJ SOLN
INTRAMUSCULAR | Status: AC
  Filled 2016-12-12: qty 1

## 2016-12-12 MED ORDER — EPINEPHRINE PF 1 MG/ML IJ SOLN
INTRAMUSCULAR | Status: AC
  Filled 2016-12-12: qty 4

## 2016-12-12 MED ORDER — PROPOFOL 500 MG/50ML IV EMUL
INTRAVENOUS | Status: DC | PRN
  Administered 2016-12-12: 125 ug/kg/min via INTRAVENOUS

## 2016-12-12 MED ORDER — SUCCINYLCHOLINE CHLORIDE 20 MG/ML IJ SOLN
INTRAMUSCULAR | Status: AC
  Filled 2016-12-12: qty 1

## 2016-12-12 MED ORDER — FENTANYL CITRATE (PF) 100 MCG/2ML IJ SOLN
25.0000 ug | INTRAMUSCULAR | Status: AC
  Administered 2016-12-12 (×2): 25 ug via INTRAVENOUS

## 2016-12-12 MED ORDER — MIDAZOLAM HCL 2 MG/2ML IJ SOLN
1.0000 mg | INTRAMUSCULAR | Status: AC
  Administered 2016-12-12: 2 mg via INTRAVENOUS

## 2016-12-12 MED ORDER — LIDOCAINE HCL (PF) 1 % IJ SOLN
INTRAMUSCULAR | Status: AC
  Filled 2016-12-12: qty 8

## 2016-12-12 MED ORDER — EPINEPHRINE PF 1 MG/ML IJ SOLN
INTRAMUSCULAR | Status: AC
  Filled 2016-12-12: qty 1

## 2016-12-12 MED ORDER — FENTANYL CITRATE (PF) 100 MCG/2ML IJ SOLN
INTRAMUSCULAR | Status: AC
  Filled 2016-12-12: qty 2

## 2016-12-12 MED ORDER — DIPHENHYDRAMINE HCL 25 MG PO CAPS: ORAL_CAPSULE | ORAL | 0 refills | Status: DC

## 2016-12-12 MED ORDER — PREDNISONE 10 MG PO TABS: ORAL_TABLET | ORAL | 0 refills | Status: DC

## 2016-12-12 MED ORDER — CHLORHEXIDINE GLUCONATE CLOTH 2 % EX PADS
6.0000 | MEDICATED_PAD | Freq: Once | CUTANEOUS | Status: DC
Start: 2016-12-12 — End: 2016-12-12

## 2016-12-12 MED ORDER — LACTATED RINGERS IV SOLN
INTRAVENOUS | Status: DC
  Administered 2016-12-12: 1000 mL via INTRAVENOUS

## 2016-12-12 MED ORDER — PROPOFOL 10 MG/ML IV BOLUS
INTRAVENOUS | Status: AC
  Filled 2016-12-12: qty 40

## 2016-12-12 MED ORDER — SODIUM CHLORIDE 0.9 % IJ SOLN
INTRAMUSCULAR | Status: AC
  Filled 2016-12-12: qty 10

## 2016-12-12 NOTE — Anesthesia Postprocedure Evaluation (Signed)
Anesthesia Post Note  Patient: Kenneth Santiago  Procedure(s) Performed: Procedure(s) (LRB): ESOPHAGOGASTRODUODENOSCOPY (EGD) WITH PROPOFOL (N/A)  Patient location during evaluation: PACU Anesthesia Type: MAC Level of consciousness: awake and alert Pain management: pain level controlled Vital Signs Assessment: post-procedure vital signs reviewed and stable Respiratory status: spontaneous breathing Cardiovascular status: stable Anesthetic complications: no     Last Vitals:  Vitals:   12/12/16 0850 12/12/16 0930  BP: 123/72 129/71  Pulse:  63  Resp: 12 (!) 21  Temp:  (P) 36.5 C    Last Pain:  Vitals:   12/12/16 0812  TempSrc: Oral  PainSc: 4                  [name] A

## 2016-12-12 NOTE — Discharge Instructions (Signed)
Gastroparesis Gastroparesis, also called delayed gastric emptying, is a condition in which food takes longer than normal to empty from the stomach. The condition is usually long-lasting (chronic). What are the causes? This condition may be caused by: An endocrine disorder, such as hypothyroidism or diabetes. Diabetes is the most common cause of this condition. A nervous system disease, such as Parkinson disease or multiple sclerosis. Cancer, infection, or surgery of the stomach or vagus nerve. A connective tissue disorder, such as scleroderma. Certain medicines. In most cases, the cause is not known. What increases the risk? This condition is more likely to develop in: People with certain disorders, including endocrine disorders, eating disorders, amyloidosis, and scleroderma. People with certain diseases, including Parkinson disease or multiple sclerosis. People with cancer or infection of the stomach or vagus nerve. People who have had surgery on the stomach or vagus nerve. People who take certain medicines. Women. What are the signs or symptoms? Symptoms of this condition include: An early feeling of fullness when eating. Nausea. Weight loss. Vomiting. Heartburn. Abdominal bloating. Inconsistent blood glucose levels. Lack of appetite. Acid from the stomach coming up into the esophagus (gastroesophageal reflux). Spasms of the stomach. Symptoms may come and go. How is this diagnosed? This condition is diagnosed with tests, such as: Tests that check how long it takes food to move through the stomach and intestines. These tests include: Upper gastrointestinal (GI) series. In this test, X-rays of the intestines are taken after you drink a liquid. The liquid makes the intestines show up better on the X-rays. Gastric emptying scintigraphy. In this test, scans are taken after you eat food that contains a small amount of radioactive material. Wireless capsule GI monitoring system. This  test involves swallowing a capsule that records information about movement through the stomach. Gastric manometry. This test measures electrical and muscular activity in the stomach. It is done with a thin tube that is passed down the throat and into the stomach. Endoscopy. This test checks for abnormalities in the lining of the stomach. It is done with a long, thin tube that is passed down the throat and into the stomach. An ultrasound. This test can help rule out gallbladder disease or pancreatitis as a cause of your symptoms. It uses sound waves to take pictures of the inside of your body. How is this treated? There is no cure for gastroparesis. This condition may be managed with: Treatment of the underlying condition causing the gastroparesis. Lifestyle changes, including exercise and dietary changes. Dietary changes can include: Changes in what and when you eat. Eating smaller meals more often. Eating low-fat foods. Eating low-fiber forms of high-fiber foods, such as cooked vegetables instead of raw vegetables. Having liquid foods in place of solid foods. Liquid foods are easier to digest. Medicines. These may be given to control nausea and vomiting and to stimulate stomach muscles. Getting food through a feeding tube. This may be done in severe cases. A gastric neurostimulator. This is a device that is inserted into the body with surgery. It helps improve stomach emptying and control nausea and vomiting. Follow these instructions at home: Follow your health care provider's instructions about exercise and diet. Take medicines only as directed by your health care provider. Contact a health care provider if: Your symptoms do not improve with treatment. You have new symptoms. Get help right away if: You have severe abdominal pain that does not improve with treatment. You have nausea that does not go away. You cannot keep fluids down. This  information is not intended to replace advice  given to you by your health care provider. Make sure you discuss any questions you have with your health care provider. Document Released: 09/09/2005 Document Revised: 02/15/2016 Document Reviewed: 09/05/2014 Elsevier Interactive Patient Education  2017 [employer]. EGD Discharge instructions Please read the instructions outlined below and refer to this sheet in the next few weeks. These discharge instructions provide you with general information on caring for yourself after you leave the hospital. Your doctor may also give you specific instructions. While your treatment has been planned according to the most current medical practices available, unavoidable complications occasionally occur. If you have any problems or questions after discharge, please call your doctor. ACTIVITY  You may resume your regular activity but move at a slower pace for the next 24 hours.   Take frequent rest periods for the next 24 hours.   Walking will help expel (get rid of) the air and reduce the bloated feeling in your abdomen.   No driving for 24 hours (because of the anesthesia (medicine) used during the test).   You may shower.   Do not sign any important legal documents or operate any machinery for 24 hours (because of the anesthesia used during the test).  NUTRITION  Drink plenty of fluids.   You may resume your normal diet.   Begin with a light meal and progress to your normal diet.   Avoid alcoholic beverages for 24 hours or as instructed by your caregiver.  MEDICATIONS  You may resume your normal medications unless your caregiver tells you otherwise.  WHAT YOU CAN EXPECT TODAY  You may experience abdominal discomfort such as a feeling of fullness or gas pains.  FOLLOW-UP  Your doctor will discuss the results of your test with you.  SEEK IMMEDIATE MEDICAL ATTENTION IF ANY OF THE FOLLOWING OCCUR:  Excessive nausea (feeling sick to your stomach) and/or vomiting.   Severe abdominal pain  and distention (swelling).   Trouble swallowing.   Temperature over 101 F (37.8 C).   Rectal bleeding or vomiting of blood.    Continue Protonix 40 mg twice daily  Low residue / gastroparesis diet  Pancreatic protocol CT - abnormal proximal duodenum / stricture on EGD  Further recommendations to follow  Further recommendations to follow

## 2016-12-12 NOTE — Telephone Encounter (Signed)
Attempted to submit PA for CT via [facility] website. No PA needed.

## 2016-12-12 NOTE — H&P (View-Only) (Signed)
Primary Care Physician: Gar Ponto, MD  Primary Gastroenterologist:  Garfield Cornea, MD   Chief Complaint  Patient presents with  . Ulcer    HPI: Kenneth Santiago is a 76 y.o. male here for follow up. Last seen in 10/2016. H/O recent hospitalization for UGI bleeding related to duodenal ulcer (07/2016).  A 12 mm (at least) deep bulbar ulcer crater that did not require any endoscopic therapy. Gastric biopsies - H pylori negative. Abnormality in this area seen on CT there was concern for a mass. H.pylori serologies recently negative. No NSAID use. Remote h/o celiac disase but TTG/total IgA normal (2016). Small bowel not biopsied recently in setting of bleeding. Colonoscopy in 05/2015, normal ileocolonoscopy and random colon bx negative. Procedure done for diarrhea.   Patient had normal labs recently last month with exception of TSH slightly elevated and glucose over 200. Reported h/o diabetes requiring medication in past, but he has been off for years and doesn't check his sugars. Complains of slime and mucous in stools. Started wearing depends at night. Will have 3-4 loose stools back to back, this will happen several times a day and wakes him up from sleep. Weight down 10 pounds in the past one month. Doesn't matter what eats still has diarrhea. No longer maintains gluten free diet. Intermittent vomiting, after meals. Few days ago, vomit coffee grounds and stool black. Daughter present today brought hand written note voicing her concerns of fecal incontinence, hematemesis, melena, weight loss, confusion. Patient repeated his history of recent melena at least 3 times during the visit today.   He takes Kaopectate frequently. His stomach "has been messed up as long as I can remember". "I have bought cases of Kaopectate at a time in the past".   Also complains of left lower abdominal pain, ruq pain.   h/o celiac disease diagnosed years ago by serologies ordered by PCP. TTG, IGA and IGA level  normal couple of years ago.   Daughter reports patient eats dinner with them 5 days a week. At other times, he has canned vegetables, pork n beans, dry cereal/juice.     Current Outpatient Prescriptions  Medication Sig Dispense Refill  . dorzolamide-timolol (COSOPT) 22.3-6.8 MG/ML ophthalmic solution Place 1 drop into both eyes daily.    Marland Kitchen latanoprost (XALATAN) 0.005 % ophthalmic solution 1 drop 2 (two) times daily.    Marland Kitchen levothyroxine (SYNTHROID, LEVOTHROID) 125 MCG tablet Take 175 mcg by mouth daily before breakfast.     . losartan (COZAAR) 50 MG tablet 1 Tablet(s) PO daily  0  . OVER THE COUNTER MEDICATION Antidiarrheal as needed (unsure of name)    . pantoprazole (PROTONIX) 40 MG tablet Take 1 tablet (40 mg total) by mouth 2 (two) times daily. 60 tablet 2  . sertraline (ZOLOFT) 50 MG tablet take 1 tablet by mouth BID for depression  0   No current facility-administered medications for this visit.     Allergies as of 12/11/2016 - Review Complete 12/11/2016  Allergen Reaction Noted  . Iodinated diagnostic agents Rash 04/03/2015   Past Medical History:  Diagnosis Date  . Abdominal pain   . Actinic keratosis   . Basal cell carcinoma    on face  . Celiac disease    ??? reported h/o positive serologies by PCP remotely  . Diabetes ([place])   . Diverticulosis   . Essential hypertension   . GERD (gastroesophageal reflux disease)   . Glaucoma   . Hyperlipidemia   . Hypothyroidism   .  Major depression   . Metabolic syndrome   . Osteoarthritis    Past Surgical History:  Procedure Laterality Date  . BACK SURGERY  1987  . BIOPSY N/A 05/25/2015   Procedure: BIOPSY;  Surgeon: Daneil Dolin, MD;  Location: [facility]S;  Service: Endoscopy;  Laterality: N/A;  right and descending/sigmoid colon  . BIOPSY  08/22/2016   Procedure: BIOPSY;  Surgeon: Daneil Dolin, MD;  Location: [facility] SU[facility];  Service: Endoscopy;;  gastric     . CATARACT EXTRACTION Bilateral 1988  . CHOLECYSTECTOMY    .  COLONOSCOPY     2007 at [facility]  . COLONOSCOPY WITH PROPOFOL N/A 05/25/2015   VPX:TGGYIR anal pipilla otherwise normal/pancolonic diverticulosis  . ESOPHAGOGASTRODUODENOSCOPY     2007 at [facility]  . ESOPHAGOGASTRODUODENOSCOPY (EGD) WITH PROPOFOL N/A 08/22/2016   Procedure: ESOPHAGOGASTRODUODENOSCOPY (EGD) WITH PROPOFOL;  Surgeon: Daneil Dolin, MD;  Location: [facility];  Service: Endoscopy;  Laterality: N/A;  . EYES SURGERY Bilateral    retinal detatchment x2   Family History  Problem Relation Age of Onset  . CVA Father   . Dementia Mother   . Hyperlipidemia Sister   . Hypothyroidism Sister   . Colon cancer Neg Hx    Social History   Social History  . Marital status: Widowed    Spouse name: N/A  . Number of children: N/A  . Years of education: N/A   Social History Main Topics  . Smoking status: Never Smoker  . Smokeless tobacco: Never Used  . Alcohol use No  . Drug use: No  . Sexual activity: Not Asked   Other Topics Concern  . None   Social History Narrative  . None    ROS:  General: Negative for anorexia,   fever, chills, fatigue,++weakness. ++ weight loss.  ENT: Negative for hoarseness, difficulty swallowing , nasal congestion. CV: Negative for chest pain, angina, palpitations, dyspnea on exertion, peripheral edema.  Respiratory: Negative for dyspnea at rest, dyspnea on exertion, cough, sputum, wheezing.  GI: See history of present illness. GU:  Negative for dysuria, hematuria, urinary incontinence, urinary frequency, nocturnal urination.  Endo: see hpi   Physical Examination:   BP (!) 159/79   Pulse 67   Temp 97.9 F (36.6 C) (Oral)   Ht 5\' 11"  (1.803 m)   Wt 183 lb 9.6 oz (83.3 kg)   BMI 25.61 kg/m   General: bearded, wm, accompanied by dgt. NAD. Repeated his history few times regarding melena.  Eyes: No icterus. Mouth: Oropharyngeal mucosa moist and pink , no lesions erythema or exudate. Lungs: Clear to auscultation bilaterally.  Heart:  Regular rate and rhythm, no murmurs rubs or gallops.  Abdomen: Bowel sounds are normal, ruq/epig tenderness with palpation, milder more diffuse tenderness noted.  nondistended, no hepatosplenomegaly or masses, no abdominal bruits or hernia , no rebound or guarding.   Extremities: No lower extremity edema. No clubbing or deformities. Neuro: Alert and oriented x 4   Skin: Warm and dry, no jaundice.   Psych: Alert and cooperative, normal mood and affect.  Labs:  Lab Results  Component Value Date   WBC 5.6 11/05/2016   HGB 14.9 11/05/2016   HCT 43.4 11/05/2016   MCV 87 11/05/2016   PLT 173 11/05/2016   Lab Results  Component Value Date   TSH 5.820 (H) 11/05/2016   Lab Results  Component Value Date   CREATININE 0.73 (L) 11/05/2016   BUN 11 11/05/2016   NA 140 11/05/2016   K  4.5 11/05/2016   CL 100 11/05/2016   CO2 27 11/05/2016   Lab Results  Component Value Date   ALT 15 11/05/2016   AST 15 11/05/2016   ALKPHOS 87 11/05/2016   BILITOT 0.6 11/05/2016    Imaging Studies: No results found.   Impression/Plan:  76 y/o male with h/o UGI bleeding in 07/2016 due to large duodenal ulcer, gastric bx neg for H.pylori, recent serologies negative as well, who presents for further evaluation of weight loss, coffee ground emesis/melena (with h/o Kaopectate use), persistent chronic diarrhea with prior reported positive celiac serologies remotely. No reported NSAID use. He is on pantoprazole BID. He is having significant stool frequency and urgency even nocturnally.   Multiple other issues of h/o diabetes no longer on meds, recent glucose over 200.   Will repeat labs today given recent reported melena, also obtain celiac panel (reports frequent gluten exposure), HgbA1C. Plan on EGD in OR (failed conscious sedation in the past) as soon as possible.  I have discussed the risks, alternatives, benefits with regards to but not limited to the risk of reaction to medication, bleeding, infection,  perforation and the patient is agreeable to proceed. Written consent to be obtained. Consider small bowel biopsy to rule out celiac disease.   Currently colonoscopy is up to date, 2016 for diarrhea and normal including random colon bx and terminal ileum appeared normal.

## 2016-12-12 NOTE — Telephone Encounter (Signed)
They need to make sure it's a pancreatic protocol CT. Need good look at the pancreas.  For the patient, prednisone 50 mg orally 13 hours before the procedure then another 50 mg 7 hours prior to the procedure and then 50 mg one hour prior to the procedure. Give Benadryl 50 mg orally one hour prior to the procedure and this will cover his contrast allergy

## 2016-12-12 NOTE — Progress Notes (Signed)
So far his celiac labs are negative, one is still pending.  His HgA1C: 3 month check on diabetes control is elevated. He needs to see PCP and get started back on medications for his diabetes. Send copy to pcp.

## 2016-12-12 NOTE — Op Note (Signed)
[facility] Patient Name: Kenneth Santiago Procedure Date: 12/12/2016 8:41 AM MRN: [id number] Date of Birth: May 14, 1941 Attending MD: Norvel Richards , MD CSN: [id number] Age: 76 Admit Type: Outpatient Procedure:                Upper GI endoscopy with duodenal balloon dilation                            and biopsy Indications:              Failure to respond to medical treatment, ,                            Diarrhea, abnormal duodenum Providers:                Norvel Richards, MD, Hinton Rao, RN, Aram Candela Referring MD:              Medicines:                Propofol per Anesthesia Complications:            No immediate complications. Estimated Blood Loss:     Estimated blood loss was minimal. Procedure:                Pre-Anesthesia Assessment:                           - Prior to the procedure, a History and Physical                            was performed, and patient medications and                            allergies were reviewed. The patient's tolerance of                            previous anesthesia was also reviewed. The risks                            and benefits of the procedure and the sedation                            options and risks were discussed with the patient.                            All questions were answered, and informed consent                            was obtained. Prior Anticoagulants: The patient has                            taken no previous anticoagulant or antiplatelet  agents. ASA Grade Assessment: III - A patient with                            severe systemic disease. After reviewing the risks                            and benefits, the patient was deemed in                            satisfactory condition to undergo the procedure.                           After obtaining informed consent, the endoscope was                            passed under direct vision.  Throughout the                            procedure, the patient's blood pressure, pulse, and                            oxygen saturations were monitored continuously. The                            EG-[id number] ([id number]) scope was introduced through the                            mouth, and advanced to the third part of duodenum.                            The upper GI endoscopy was accomplished without                            difficulty. The patient tolerated the procedure                            well. The upper GI endoscopy was accomplished                            without difficulty. The patient tolerated the                            procedure well. Scope In: 9:04:14 AM Scope Out: 9:18:44 AM Total Procedure Duration: 0 hours 14 minutes 30 seconds  Findings:      The examined esophagus was normal.      A small hiatal hernia was present. significant amount of retained       gastric contents (last intake of solid food 13 hours prior to this       procedure). Inflamed, erythematous gastric mucosa. Not all of the mucosa       seen. No infiltrating process seen. Patent pylorus. Bulb OK; junction       between bulb and second portion markedly inflamed and edematous.       Previously noted duodenal ulcer not seen. Scope would  could not be       advanced beyond the narrowed segment into the second/ third portion of       the duodenum.      Utilizing a TTS balloon the narrowing in the duodenum was dilated with       TTS balloon to 15 mm held for 1 minute and then taken down. This did       open up the lumen to some degree.There was minimal bleeding; no apparent       complication. I was able to get the scope down well into in the second       portion of the duodenum. This portion appeared normal. 2 biopsies of the       second and third portion of the duodenum. Impression:               - Normal esophagus.                           - Small hiatal hernia. Significant retained gastric                             contents. Duodenal stricture status post dilation                            and biopsy                           - S/P duodenal biopsy. Moderate Sedation:      Moderate (conscious) sedation was personally administered by an       anesthesia professional. The following parameters were monitored: oxygen       saturation, heart rate, blood pressure, respiratory rate, EKG, adequacy       of pulmonary ventilation, and response to care. Total physician       intraservice time was 18 minutes. Recommendation:           - Patient has a contact number available for                            emergencies. The signs and symptoms of potential                            delayed complications were discussed with the                            patient. Return to normal activities tomorrow.                            Written discharge instructions were provided to the                            patient.                           - Resume previous diet. Need gastroparesis/low                            residue diet. pancreatic protocol CT to further  evaluate mass effect seen in the area of the                            duodenum in November. Hemoglobin A1c yesterday 7.4.                            Patient likely has diabetes.Therefore, may have an                            element of diabetic gastroparesis but certainly has                            a bottle neck in the proximal duodenumcontributing                            to his failure to thrive.                           - Continue present medications.                           - Await pathology results. Procedure Code(s):        --- Professional ---                           [phone], Esophagogastroduodenoscopy, flexible,                            transoral; diagnostic, including collection of                            specimen(s) by brushing or washing, when performed                             (separate procedure) Diagnosis Code(s):        --- Professional ---                           K44.9, Diaphragmatic hernia without obstruction or                            gangrene                           R19.7, Diarrhea, unspecified CPT copyright 2016 [employer]. All rights reserved. The codes documented in this report are preliminary and upon coder review may  be revised to meet current compliance requirements. Kenneth Santiago. Kenneth Becknell, MD Norvel Richards, MD 12/12/2016 9:52:39 AM This report has been signed electronically. Number of Addenda: 0

## 2016-12-12 NOTE — Interval H&P Note (Signed)
History and Physical Interval Note:  12/12/2016 8:50 AM  Kenneth Santiago  has presented today for surgery, with the diagnosis of UGI bleed, melena, h/o duodenal ulcer  The various methods of treatment have been discussed with the patient and family. After consideration of risks, benefits and other options for treatment, the patient has consented to  Procedure(s) with comments: ESOPHAGOGASTRODUODENOSCOPY (EGD) WITH PROPOFOL (N/A) - 9:15am as a surgical intervention .  The patient's history has been reviewed, patient examined, no change in status, stable for surgery.  I have reviewed the patient's chart and labs.  Questions were answered to the patient's satisfaction.     Kenneth Santiago  No change; Dx EGD per plan.  The risks, benefits, limitations, alternatives and imponderables have been reviewed with the patient. Potential for esophageal dilation, biopsy, etc. have also been reviewed.  Questions have been answered. All parties agreeable.

## 2016-12-12 NOTE — Telephone Encounter (Signed)
Dr. Gala Romney, I called CT dept to find out how to put order in for pancreatic protocol CT. I was informed to put in as CT abdomen with and without contrast. The pt is allergic to contrast. Please advise.

## 2016-12-12 NOTE — Transfer of Care (Signed)
Immediate Anesthesia Transfer of Care Note  Patient: Kenneth Santiago  Procedure(s) Performed: Procedure(s) with comments: ESOPHAGOGASTRODUODENOSCOPY (EGD) WITH PROPOFOL (N/A) - 9:15am  Patient Location: PACU  Anesthesia Type:MAC  Level of Consciousness: awake, alert , oriented and patient cooperative  Airway & Oxygen Therapy: Patient Spontanous Breathing and Patient connected to nasal cannula oxygen  Post-op Assessment: Report given to RN and Post -op Vital signs reviewed and stable  Post vital signs: Reviewed and stable  Last Vitals:  Vitals:   12/12/16 0845 12/12/16 0850  BP: 123/77 123/72  Pulse:    Resp: 16 12  Temp:      Last Pain:  Vitals:   12/12/16 0812  TempSrc: Oral  PainSc: 4       Patients Stated Pain Goal: 7 (56/38/93 7342)  Complications: No apparent anesthesia complications

## 2016-12-12 NOTE — Anesthesia Preprocedure Evaluation (Signed)
Anesthesia Evaluation  Patient identified by MRN, date of birth, ID band Patient awake    Reviewed: Allergy & Precautions, NPO status , Patient's Chart, lab work & pertinent test results  Airway Mallampati: II  TM Distance: >3 FB     Dental  (+) Partial Upper, Partial Lower   Pulmonary neg pulmonary ROS,    breath sounds clear to auscultation       Cardiovascular hypertension, Pt. on medications  Rhythm:Regular Rate:Normal     Neuro/Psych PSYCHIATRIC DISORDERS Depression    GI/Hepatic PUD, GERD  ,  Endo/Other  diabetes, Poorly Controlled, Type 2Hypothyroidism   Renal/GU      Musculoskeletal   Abdominal   Peds  Hematology   Anesthesia Other Findings   Reproductive/Obstetrics                             Anesthesia Physical Anesthesia Plan  ASA: III  Anesthesia Plan: MAC   Post-op Pain Management:    Induction: Intravenous  Airway Management Planned: Simple Face Mask  Additional Equipment:   Intra-op Plan:   Post-operative Plan:   Informed Consent: I have reviewed the patients History and Physical, chart, labs and discussed the procedure including the risks, benefits and alternatives for the proposed anesthesia with the patient or authorized representative who has indicated his/her understanding and acceptance.     Plan Discussed with:   Anesthesia Plan Comments:         Anesthesia Quick Evaluation

## 2016-12-12 NOTE — Telephone Encounter (Signed)
Rx for Prednisone and Benadryl sent to [facility]. CT scheduled for 12/16/16 at 9:30am, pt will be a work-in. NPO 4 hours prior to test.  Tried to call pt. Spoke to his daughter Olean Ree), pt was with her. Informed of CT appt and instructions. Also informed of Prednisone and Benadryl instructions. Informed her that pt will be a work in and may have to wait.

## 2016-12-12 NOTE — Anesthesia Procedure Notes (Signed)
Procedure Name: MAC Date/Time: 12/12/2016 8:55 AM Performed by: Andree Elk, AMY A Pre-anesthesia Checklist: Emergency Drugs available, Patient identified, Suction available, Patient being monitored and Timeout performed Oxygen Delivery Method: Simple face mask

## 2016-12-13 LAB — RETICULIN ANTIBODIES, IGA W TITER: Reticulin Ab, IgA: NEGATIVE titer (ref <2.5)

## 2016-12-15 ENCOUNTER — Encounter: Payer: Self-pay | Admitting: Internal Medicine

## 2016-12-16 ENCOUNTER — Ambulatory Visit: Payer: Medicare Other | Admitting: Gastroenterology

## 2016-12-16 ENCOUNTER — Ambulatory Visit (HOSPITAL_COMMUNITY)
Admission: RE | Admit: 2016-12-16 | Discharge: 2016-12-16 | Disposition: A | Discharge status: Home / Self Care | Payer: Medicare Other | Source: Ambulatory Visit | Attending: Internal Medicine | Admitting: Internal Medicine

## 2016-12-16 DIAGNOSIS — I700 Atherosclerosis of aorta: Secondary | ICD-10-CM | POA: Insufficient documentation

## 2016-12-16 DIAGNOSIS — K3189 Other diseases of stomach and duodenum: Secondary | ICD-10-CM | POA: Diagnosis not present

## 2016-12-16 DIAGNOSIS — K269 Duodenal ulcer, unspecified as acute or chronic, without hemorrhage or perforation: Secondary | ICD-10-CM | POA: Diagnosis not present

## 2016-12-16 DIAGNOSIS — R1900 Intra-abdominal and pelvic swelling, mass and lump, unspecified site: Secondary | ICD-10-CM | POA: Diagnosis not present

## 2016-12-16 MED ORDER — IOPAMIDOL (ISOVUE-370) INJECTION 76%
100.0000 mL | Freq: Once | INTRAVENOUS | Status: AC | PRN
Start: 2016-12-16 — End: 2016-12-16
  Administered 2016-12-16: 100 mL via INTRAVENOUS

## 2016-12-17 ENCOUNTER — Encounter: Payer: Self-pay | Admitting: Internal Medicine

## 2016-12-17 ENCOUNTER — Telehealth: Payer: Self-pay

## 2016-12-17 ENCOUNTER — Other Ambulatory Visit: Payer: Self-pay | Admitting: Gastroenterology

## 2016-12-17 DIAGNOSIS — K625 Hemorrhage of anus and rectum: Secondary | ICD-10-CM

## 2016-12-17 MED ORDER — POTASSIUM CHLORIDE ER 10 MEQ PO TBCR: 20.0000 meq | EXTENDED_RELEASE_TABLET | Freq: Two times a day (BID) | ORAL | 0 refills | Status: DC

## 2016-12-17 NOTE — Telephone Encounter (Signed)
Spoke with the pts daughter- Kenneth Santiago, she said the pt was not in as much pain as he was before, he is more coherent now and he is only getting up to go to the bathroom about once a night.  She also said he told her that his stool is more formed now but he is noticing dark blood in the stool.  Please advise.

## 2016-12-17 NOTE — Progress Notes (Signed)
APPT MADE AND LETTER SENT  °

## 2016-12-18 ENCOUNTER — Other Ambulatory Visit: Payer: Self-pay

## 2016-12-18 DIAGNOSIS — K625 Hemorrhage of anus and rectum: Secondary | ICD-10-CM

## 2016-12-18 NOTE — Telephone Encounter (Signed)
pts daughter is aware. He is still seeing blood in his stool. Lab order done. She will take him to the lab today.

## 2016-12-18 NOTE — Telephone Encounter (Signed)
If this is recurrent, let's get a CBC this afternoon or tomorrow.

## 2016-12-19 LAB — CBC WITH DIFFERENTIAL/PLATELET
BASOS: 0 %
Basophils Absolute: 0 10*3/uL (ref 0.0–0.2)
EOS (ABSOLUTE): 0.4 10*3/uL (ref 0.0–0.4)
EOS: 5 %
HEMATOCRIT: 42.7 % (ref 37.5–51.0)
HEMOGLOBIN: 14.5 g/dL (ref 13.0–17.7)
Immature Grans (Abs): 0 10*3/uL (ref 0.0–0.1)
Immature Granulocytes: 0 %
Lymphocytes Absolute: 2.7 10*3/uL (ref 0.7–3.1)
Lymphs: 36 %
MCH: 30.1 pg (ref 26.6–33.0)
MCHC: 34 g/dL (ref 31.5–35.7)
MCV: 89 fL (ref 79–97)
MONOCYTES: 10 %
Monocytes Absolute: 0.8 10*3/uL (ref 0.1–0.9)
Neutrophils Absolute: 3.7 10*3/uL (ref 1.4–7.0)
Neutrophils: 49 %
Platelets: 192 10*3/uL (ref 150–379)
RBC: 4.82 x10E6/uL (ref 4.14–5.80)
RDW: 14.3 % (ref 12.3–15.4)
WBC: 7.7 10*3/uL (ref 3.4–10.8)

## 2016-12-23 ENCOUNTER — Encounter (HOSPITAL_COMMUNITY): Payer: Self-pay | Admitting: Internal Medicine

## 2016-12-26 NOTE — Progress Notes (Signed)
No evidence of celiac disease

## 2017-01-30 DIAGNOSIS — K4030 Unilateral inguinal hernia, with obstruction, without gangrene, not specified as recurrent: Secondary | ICD-10-CM | POA: Diagnosis not present

## 2017-01-30 DIAGNOSIS — Z6824 Body mass index (BMI) 24.0-24.9, adult: Secondary | ICD-10-CM | POA: Diagnosis not present

## 2017-01-30 DIAGNOSIS — E039 Hypothyroidism, unspecified: Secondary | ICD-10-CM | POA: Diagnosis not present

## 2017-02-05 DIAGNOSIS — R1031 Right lower quadrant pain: Secondary | ICD-10-CM | POA: Diagnosis not present

## 2017-02-26 DIAGNOSIS — R1031 Right lower quadrant pain: Secondary | ICD-10-CM | POA: Diagnosis not present

## 2017-03-03 DIAGNOSIS — R197 Diarrhea, unspecified: Secondary | ICD-10-CM | POA: Diagnosis not present

## 2017-03-03 DIAGNOSIS — I10 Essential (primary) hypertension: Secondary | ICD-10-CM | POA: Diagnosis not present

## 2017-03-03 DIAGNOSIS — E039 Hypothyroidism, unspecified: Secondary | ICD-10-CM | POA: Diagnosis not present

## 2017-03-03 DIAGNOSIS — Z6823 Body mass index (BMI) 23.0-23.9, adult: Secondary | ICD-10-CM | POA: Diagnosis not present

## 2017-03-03 DIAGNOSIS — R1031 Right lower quadrant pain: Secondary | ICD-10-CM | POA: Diagnosis not present

## 2017-03-03 DIAGNOSIS — E782 Mixed hyperlipidemia: Secondary | ICD-10-CM | POA: Diagnosis not present

## 2017-03-03 DIAGNOSIS — E1165 Type 2 diabetes mellitus with hyperglycemia: Secondary | ICD-10-CM | POA: Diagnosis not present

## 2017-03-05 DIAGNOSIS — R197 Diarrhea, unspecified: Secondary | ICD-10-CM | POA: Diagnosis not present

## 2017-03-07 DIAGNOSIS — R1031 Right lower quadrant pain: Secondary | ICD-10-CM | POA: Diagnosis not present

## 2017-03-12 DIAGNOSIS — E782 Mixed hyperlipidemia: Secondary | ICD-10-CM | POA: Diagnosis not present

## 2017-03-12 DIAGNOSIS — I10 Essential (primary) hypertension: Secondary | ICD-10-CM | POA: Diagnosis not present

## 2017-03-12 DIAGNOSIS — E039 Hypothyroidism, unspecified: Secondary | ICD-10-CM | POA: Diagnosis not present

## 2017-03-12 DIAGNOSIS — E1165 Type 2 diabetes mellitus with hyperglycemia: Secondary | ICD-10-CM | POA: Diagnosis not present

## 2017-03-17 DIAGNOSIS — K4090 Unilateral inguinal hernia, without obstruction or gangrene, not specified as recurrent: Secondary | ICD-10-CM | POA: Diagnosis not present

## 2017-03-19 ENCOUNTER — Encounter: Payer: Self-pay | Admitting: Gastroenterology

## 2017-03-19 ENCOUNTER — Ambulatory Visit (INDEPENDENT_AMBULATORY_CARE_PROVIDER_SITE_OTHER): Payer: Medicare Other | Admitting: Gastroenterology

## 2017-03-19 VITALS — BP 138/74 | HR 80 | Temp 97.0°F | Ht 68.0 in | Wt 178.4 lb

## 2017-03-19 DIAGNOSIS — R634 Abnormal weight loss: Secondary | ICD-10-CM

## 2017-03-19 DIAGNOSIS — K269 Duodenal ulcer, unspecified as acute or chronic, without hemorrhage or perforation: Secondary | ICD-10-CM | POA: Diagnosis not present

## 2017-03-19 DIAGNOSIS — R6881 Early satiety: Secondary | ICD-10-CM | POA: Diagnosis not present

## 2017-03-19 DIAGNOSIS — R933 Abnormal findings on diagnostic imaging of other parts of digestive tract: Secondary | ICD-10-CM

## 2017-03-19 DIAGNOSIS — K529 Noninfective gastroenteritis and colitis, unspecified: Secondary | ICD-10-CM

## 2017-03-19 NOTE — Progress Notes (Signed)
Primary Care Physician: Caryl Bis, MD  Primary Gastroenterologist:  Garfield Cornea, MD   Chief Complaint  Patient presents with  . Follow-up    HPI: Kenneth Santiago is a 76 y.o. male here for follow up. History of upper GI bleed due to a large duodenal ulcer last year. H. pylori negative by histology as well as by serologies. No reported NSAIDs. It was somewhat suspicious looking ulcer on CT but endoscopically appeared benign. Remote h/o celiac disase but TTG/total IgA normal (2016). Small bowel not biopsied recently in setting of bleeding. Colonoscopy in 05/2015, normal ileocolonoscopy and random colon bx negative. Procedure done for diarrhea.   Patient underwent another EGD on 12/12/2016 look back at the abnormality seen in the duodenum. Significant amount of retained gastric contents, last intake of solid food 13 hours prior to procedure. Inflamed erythematous gastric mucosa. Not all mucosa was seen. Junction between the bulb and the second portion of the duodenum markedly inflamed and edematous. Previously noted duodenal ulcer not seen. Scope could not be passed beyond the narrowed segment into the second/third portion of duodenum. Dilated with the balloon up to 15 mm. Scope passed well into the second portion of duodenum without any abnormality seen. Biopsies taken from second third portion of duodenum were benign. Small hiatal hernia.  CT abdomen with without contrast done to better look for any external compression on the duodenum possibly from the pancreas. Noted to have persistent under distention versus narrowing of the descending duodenum with suspected centric wall thickening proximally. While this can be due to scarring and stenosis from her prior ulcer, malignant lesion cannot  Weight is down from 194 to 178 since 2/108.   Since her last saw the patient he was started on Creon last week by PCP. He's taken 36,000 units 2 with meals and one with snacks. Has diarrhea about  every other day usually one to 2 times. Sometimes pretty significant amounts. Denies any fresh blood per rectum. Stools were dark couple weeks ago. Patient consumes Kaopectate fairly regularly, see last office note. He states the toilet water never turns red. He doesn't feel like it's blood. He complains of early satiety and postprandial nausea. His daughter provides meals for him at least 2 daily. They feel like he is eating adequately to maintain his weight but he continues to lose weight. Reports upcoming right inguinal hernia repair with Dr. Ladona Horns next Friday. He's having a lot of pain in the right inguinal area. Urged him to discuss pain management with his PCP or surgeon. He also complains of dysphagia at times. Feels like his "esophagus does not work". Not on any diabetes medication. Apparently his hemoglobin A1c was better recently per daughter.  He had recent labs with PCP in ultrasound at [facility] which we have requested. He also reports negative stool studies recently.   Current Outpatient Prescriptions  Medication Sig Dispense Refill  . CREON 36000 units CPEP capsule Take 36,000 Units by mouth.     . dorzolamide-timolol (COSOPT) 22.3-6.8 MG/ML ophthalmic solution Place 1 drop into both eyes daily.    Marland Kitchen latanoprost (XALATAN) 0.005 % ophthalmic solution 1 drop 2 (two) times daily.    Marland Kitchen levothyroxine (SYNTHROID, LEVOTHROID) 175 MCG tablet Take 175 mcg by mouth daily before breakfast.     . losartan (COZAAR) 50 MG tablet 1 Tablet(s) PO daily  0  . OVER THE COUNTER MEDICATION Antidiarrheal as needed (unsure of name)    . pantoprazole (PROTONIX) 40 MG  tablet Take 1 tablet (40 mg total) by mouth 2 (two) times daily. 60 tablet 2  . potassium chloride (K-DUR) 10 MEQ tablet Take 2 tablets (20 mEq total) by mouth 2 (two) times daily. X 4 doses 8 tablet 0  . sertraline (ZOLOFT) 50 MG tablet take 1 tablet by mouth BID for depression  0   No current facility-administered medications for this  visit.     Allergies as of 03/19/2017 - Review Complete 03/19/2017  Allergen Reaction Noted  . Iodinated diagnostic agents Rash 04/03/2015   Past Medical History:  Diagnosis Date  . Abdominal pain   . Actinic keratosis   . Basal cell carcinoma    on face  . Celiac disease    ??? reported h/o positive serologies by PCP remotely  . Diabetes ([place])   . Diverticulosis   . Essential hypertension   . GERD (gastroesophageal reflux disease)   . Glaucoma   . Hyperlipidemia   . Hypothyroidism   . Major depression   . Metabolic syndrome   . Neuropathy   . Osteoarthritis    Past Surgical History:  Procedure Laterality Date  . BACK SURGERY  1987  . BIOPSY N/A 05/25/2015   Procedure: BIOPSY;  Surgeon: Daneil Dolin, MD;  Location: [facility];  Service: Endoscopy;  Laterality: N/A;  right and descending/sigmoid colon  . BIOPSY  08/22/2016   Procedure: BIOPSY;  Surgeon: Daneil Dolin, MD;  Location: [facility] SU[facility];  Service: Endoscopy;;  gastric     . CATARACT EXTRACTION Bilateral 1988  . CHOLECYSTECTOMY    . COLONOSCOPY     2007 at [facility]  . COLONOSCOPY WITH PROPOFOL N/A 05/25/2015   WUJ:[id number] anal pipilla otherwise normal/pancolonic diverticulosis  . ESOPHAGOGASTRODUODENOSCOPY     2007 at [facility]  . ESOPHAGOGASTRODUODENOSCOPY (EGD) WITH PROPOFOL N/A 08/22/2016   Procedure: ESOPHAGOGASTRODUODENOSCOPY (EGD) WITH PROPOFOL;  Surgeon: Daneil Dolin, MD;  Location: [facility];  Service: Endoscopy;  Laterality: N/A;  . ESOPHAGOGASTRODUODENOSCOPY (EGD) WITH PROPOFOL N/A 12/12/2016   Procedure: ESOPHAGOGASTRODUODENOSCOPY (EGD) WITH PROPOFOL;  Surgeon: Daneil Dolin, MD;  Location: [facility];  Service: Endoscopy;  Laterality: N/A;  9:15am  . EYES SURGERY Bilateral    retinal detatchment x2   Family History  Problem Relation Age of Onset  . CVA Father   . Dementia Mother   . Hyperlipidemia Sister   . Hypothyroidism Sister   . Colon cancer Neg Hx    Social History   Social  History  . Marital status: Widowed    Spouse name: N/A  . Number of children: N/A  . Years of education: N/A   Social History Main Topics  . Smoking status: Never Smoker  . Smokeless tobacco: Never Used  . Alcohol use No  . Drug use: No  . Sexual activity: Not Currently    Birth control/ protection: None   Other Topics Concern  . None   Social History Narrative  . None    ROS:  General: Negative for   fever, chills, fatigue, +weakness. See hpi.  ENT: Negative for hoarseness, nasal congestion. CV: Negative for chest pain, angina, palpitations, dyspnea on exertion, peripheral edema.  Respiratory: Negative for dyspnea at rest, dyspnea on exertion, cough, sputum, wheezing.  GI: See history of present illness. GU:  Negative for dysuria, hematuria, urinary incontinence, urinary frequency, nocturnal urination.  Endo: see hpi   Physical Examination:   BP 138/74   Pulse 80   Temp 97 F (36.1 C) (Oral)  Ht 5\' 8"  (1.727 m)   Wt 178 lb 6.4 oz (80.9 kg)   BMI 27.13 kg/m   General: Well-nourished, well-developed in no acute distress. Accompanied by dgt. Somewhat agitated with her at times.  Eyes: No icterus. Mouth: Oropharyngeal mucosa moist and pink , no lesions erythema or exudate. Lungs: Clear to auscultation bilaterally.  Heart: Regular rate and rhythm, no murmurs rubs or gallops.  Abdomen: Bowel sounds are normal, nontender, nondistended, no hepatosplenomegaly or masses, no abdominal bruits, no rebound or guarding. Bulging in right groin, mildly tender easily reducible.  Extremities: No lower extremity edema. No clubbing or deformities. Neuro: Alert and oriented x 4   Skin: Warm and dry, no jaundice.   Psych: Alert and cooperative, normal mood and affect.  Labs:  Lab Results  Component Value Date   CREATININE 0.86 12/11/2016   BUN 11 12/11/2016   NA 136 12/11/2016   K 3.4 (L) 12/11/2016   CL 103 12/11/2016   CO2 26 12/11/2016   Lab Results  Component Value Date    ALT 21 12/11/2016   AST 21 12/11/2016   ALKPHOS 76 12/11/2016   BILITOT 1.0 12/11/2016   Lab Results  Component Value Date   LIPASE 37 08/21/2016   Lab Results  Component Value Date   WBC 7.7 12/18/2016   HGB 14.5 12/18/2016   HCT 42.7 12/18/2016   MCV 89 12/18/2016   PLT 192 12/18/2016   No results found for: IRON, TIBC, FERRITIN  Imaging Studies: No results found.  Impression/Plan:  76 year old gentleman with history of upper GI bleeding in November 2017 due to large duodenal ulcer, gastric biopsy negative for H pylori, H. pylori serologies negative as well who presents for follow-up. 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 Biopsies were benign. No celiac. CT abdomen with without contrast Noted to have persistent under distention versus narrowing of the descending duodenum with suspected centric wall thickening proximally. While this can be due to scarring and stenosis from her prior ulcer, malignant lesion cannot be excluded.  Patient presents back with ongoing weight loss, early satiety, postprandial nausea. Concerned about obstruction of this area either from stricture or other process. Patient has upcoming inguinal hernia repair next week. Recent ultrasound, will obtain results. Recent lab studies have been requested as well. I will be discussing the case further with Dr. Gala Romney, patient may very well need additional imaging of the duodenum either via repeat endoscopy or other means.   Patient will continue Creon for now. There are concerns of possible exocrine pancreatic insufficiency in the setting of chronic diabetes and chronic diarrhea. This was started by PCP. Remains a possibility, no harm in trying enzymes to see if that helps.  Further recommendations to follow.

## 2017-03-19 NOTE — Progress Notes (Signed)
CC'D TO PCP °

## 2017-03-19 NOTE — Patient Instructions (Signed)
1. I will review records from Dr. Quillian Quince and ultrasound. We will let you know what the next step is once I have reviewed records and discussed with Dr. Gala Romney. 2. Good luck with your hernia surgery.

## 2017-03-24 DIAGNOSIS — H43813 Vitreous degeneration, bilateral: Secondary | ICD-10-CM | POA: Diagnosis not present

## 2017-03-24 DIAGNOSIS — H524 Presbyopia: Secondary | ICD-10-CM | POA: Diagnosis not present

## 2017-03-25 NOTE — Progress Notes (Signed)
Please let patient or daughter know that Dr. Gala Romney recommends having a special test to look at the first part of the small bowel more closely. He wants an ultrasound done via upper endoscopy (EUS) of the duodenum due to previous large ulcer (07/2016) and persistent erythema/edema on EGD in March 2018 and narrowing of the descending duodenum with wall thickening seen on CT in March 2018. This may be due to scarring or stenosis from prior ulcer of malignant lesion cannot be excluded. Patient has continued weight loss. 25 pounds since February.   Send to Dr. Jerene Pitch at [facility] or Dr. Ardis Hughs at [place], whomever can do it the quickest.

## 2017-03-25 NOTE — Progress Notes (Signed)
Tried to call pts daughter- Olean Ree, [facility] with recommendations. Asked her to call us back and let us know if it is ok to refer pt for EUS.

## 2017-03-27 ENCOUNTER — Other Ambulatory Visit: Payer: Self-pay

## 2017-03-27 DIAGNOSIS — Z823 Family history of stroke: Secondary | ICD-10-CM | POA: Diagnosis not present

## 2017-03-27 DIAGNOSIS — Z79899 Other long term (current) drug therapy: Secondary | ICD-10-CM | POA: Diagnosis not present

## 2017-03-27 DIAGNOSIS — R634 Abnormal weight loss: Secondary | ICD-10-CM

## 2017-03-27 DIAGNOSIS — Z883 Allergy status to other anti-infective agents status: Secondary | ICD-10-CM | POA: Diagnosis not present

## 2017-03-27 DIAGNOSIS — Z888 Allergy status to other drugs, medicaments and biological substances status: Secondary | ICD-10-CM | POA: Diagnosis not present

## 2017-03-27 DIAGNOSIS — Z9049 Acquired absence of other specified parts of digestive tract: Secondary | ICD-10-CM | POA: Diagnosis not present

## 2017-03-27 DIAGNOSIS — Z8489 Family history of other specified conditions: Secondary | ICD-10-CM | POA: Diagnosis not present

## 2017-03-27 DIAGNOSIS — I10 Essential (primary) hypertension: Secondary | ICD-10-CM | POA: Diagnosis not present

## 2017-03-27 DIAGNOSIS — E039 Hypothyroidism, unspecified: Secondary | ICD-10-CM | POA: Diagnosis not present

## 2017-03-27 DIAGNOSIS — Z803 Family history of malignant neoplasm of breast: Secondary | ICD-10-CM | POA: Diagnosis not present

## 2017-03-27 DIAGNOSIS — K4090 Unilateral inguinal hernia, without obstruction or gangrene, not specified as recurrent: Secondary | ICD-10-CM | POA: Diagnosis not present

## 2017-03-27 DIAGNOSIS — R933 Abnormal findings on diagnostic imaging of other parts of digestive tract: Secondary | ICD-10-CM

## 2017-03-27 DIAGNOSIS — K269 Duodenal ulcer, unspecified as acute or chronic, without hemorrhage or perforation: Secondary | ICD-10-CM

## 2017-03-27 NOTE — Progress Notes (Signed)
Referral sent to Dr. Ardis Hughs at [place] via [place]. Marked as urgent.

## 2017-03-27 NOTE — Progress Notes (Signed)
Pt's daughter called office and LMOVM that pt is ok with having procedure done in [place] or [place].

## 2017-03-28 DIAGNOSIS — E039 Hypothyroidism, unspecified: Secondary | ICD-10-CM | POA: Diagnosis not present

## 2017-03-28 DIAGNOSIS — Z888 Allergy status to other drugs, medicaments and biological substances status: Secondary | ICD-10-CM | POA: Diagnosis not present

## 2017-03-28 DIAGNOSIS — I10 Essential (primary) hypertension: Secondary | ICD-10-CM | POA: Diagnosis not present

## 2017-03-28 DIAGNOSIS — K4090 Unilateral inguinal hernia, without obstruction or gangrene, not specified as recurrent: Secondary | ICD-10-CM | POA: Diagnosis not present

## 2017-03-28 DIAGNOSIS — Z823 Family history of stroke: Secondary | ICD-10-CM | POA: Diagnosis not present

## 2017-03-28 DIAGNOSIS — Z8489 Family history of other specified conditions: Secondary | ICD-10-CM | POA: Diagnosis not present

## 2017-03-28 DIAGNOSIS — Z803 Family history of malignant neoplasm of breast: Secondary | ICD-10-CM | POA: Diagnosis not present

## 2017-03-28 DIAGNOSIS — Z79899 Other long term (current) drug therapy: Secondary | ICD-10-CM | POA: Diagnosis not present

## 2017-03-28 DIAGNOSIS — Z883 Allergy status to other anti-infective agents status: Secondary | ICD-10-CM | POA: Diagnosis not present

## 2017-03-28 DIAGNOSIS — Z9049 Acquired absence of other specified parts of digestive tract: Secondary | ICD-10-CM | POA: Diagnosis not present

## 2017-03-31 ENCOUNTER — Other Ambulatory Visit: Payer: Self-pay

## 2017-03-31 ENCOUNTER — Telehealth: Payer: Self-pay

## 2017-03-31 DIAGNOSIS — R634 Abnormal weight loss: Secondary | ICD-10-CM

## 2017-03-31 NOTE — Progress Notes (Signed)
Canceled previous referral d/t it was sent to Dr. Ardis Hughs at [place]. Sent new referral to [facility] office.

## 2017-03-31 NOTE — Telephone Encounter (Signed)
I think upper EUS is a good next step (MAC sedation, radial +/- linear, next available EUS Thursday).    Also I recommend that he be referred to a surgeon to consider resection of the site or perhaps bypass of the site since he is losing weight, has obstructive symptoms which seem likely related. Even if it is not cancer it seems like it is causing him to lose weight and it will need to be removed or bypassed.          ----- Message -----  From: Jeoffrey Massed, RN  Sent: 03/31/2017 11:20 AM  To: Milus Banister, MD        ----- Message -----  From: Irven Baltimore  Sent: 03/31/2017 11:09 AM  To: Jeoffrey Massed, RN    Kenneth Santiago,    Referring Provider states that patient needs EUS    Thanks  Colletta Maryland

## 2017-04-02 NOTE — Progress Notes (Signed)
Called [facility] to f/u on referral. Colletta Maryland said that Dr. Ardis Hughs' nurse, Patty, was going to be setting up EUS and surgeon referral. Apolonio Schneiders to let Patty know to call pt's daughter, Olean Ree.

## 2017-04-02 NOTE — Telephone Encounter (Signed)
Kennyth Arnold from [facility] is calling in regarding this. Please call patients daughter Deberah Pelton to schedule. [place]

## 2017-04-03 ENCOUNTER — Other Ambulatory Visit: Payer: Self-pay

## 2017-04-03 DIAGNOSIS — R9389 Abnormal findings on diagnostic imaging of other specified body structures: Secondary | ICD-10-CM

## 2017-04-03 NOTE — Telephone Encounter (Signed)
EUS scheduled, pt instructed and medications reviewed.  Patient instructions mailed to home.  Patient to call with any questions or concerns.  

## 2017-04-17 DIAGNOSIS — H35352 Cystoid macular degeneration, left eye: Secondary | ICD-10-CM | POA: Diagnosis not present

## 2017-04-17 DIAGNOSIS — H4089 Other specified glaucoma: Secondary | ICD-10-CM | POA: Diagnosis not present

## 2017-04-24 ENCOUNTER — Ambulatory Visit (HOSPITAL_COMMUNITY): Payer: Medicare Other | Admitting: Anesthesiology

## 2017-04-24 ENCOUNTER — Ambulatory Visit (HOSPITAL_COMMUNITY)
Admission: RE | Admit: 2017-04-24 | Discharge: 2017-04-24 | Disposition: A | Discharge status: Home / Self Care | Payer: Medicare Other | Source: Ambulatory Visit | Attending: Gastroenterology | Admitting: Gastroenterology

## 2017-04-24 ENCOUNTER — Encounter (HOSPITAL_COMMUNITY): Payer: Self-pay

## 2017-04-24 ENCOUNTER — Encounter (HOSPITAL_COMMUNITY)
Admission: RE | Disposition: A | Discharge status: Home / Self Care | Payer: Self-pay | Source: Ambulatory Visit | Attending: Gastroenterology

## 2017-04-24 DIAGNOSIS — T182XXA Foreign body in stomach, initial encounter: Secondary | ICD-10-CM | POA: Diagnosis not present

## 2017-04-24 DIAGNOSIS — K311 Adult hypertrophic pyloric stenosis: Secondary | ICD-10-CM | POA: Insufficient documentation

## 2017-04-24 DIAGNOSIS — Z85828 Personal history of other malignant neoplasm of skin: Secondary | ICD-10-CM | POA: Diagnosis not present

## 2017-04-24 DIAGNOSIS — E785 Hyperlipidemia, unspecified: Secondary | ICD-10-CM | POA: Diagnosis not present

## 2017-04-24 DIAGNOSIS — E039 Hypothyroidism, unspecified: Secondary | ICD-10-CM | POA: Insufficient documentation

## 2017-04-24 DIAGNOSIS — M1990 Unspecified osteoarthritis, unspecified site: Secondary | ICD-10-CM | POA: Diagnosis not present

## 2017-04-24 DIAGNOSIS — K219 Gastro-esophageal reflux disease without esophagitis: Secondary | ICD-10-CM | POA: Insufficient documentation

## 2017-04-24 DIAGNOSIS — K315 Obstruction of duodenum: Secondary | ICD-10-CM | POA: Diagnosis not present

## 2017-04-24 DIAGNOSIS — D721 Eosinophilia: Secondary | ICD-10-CM | POA: Diagnosis not present

## 2017-04-24 DIAGNOSIS — F329 Major depressive disorder, single episode, unspecified: Secondary | ICD-10-CM | POA: Diagnosis not present

## 2017-04-24 DIAGNOSIS — K3189 Other diseases of stomach and duodenum: Secondary | ICD-10-CM | POA: Diagnosis not present

## 2017-04-24 DIAGNOSIS — K900 Celiac disease: Secondary | ICD-10-CM | POA: Insufficient documentation

## 2017-04-24 DIAGNOSIS — R9389 Abnormal findings on diagnostic imaging of other specified body structures: Secondary | ICD-10-CM

## 2017-04-24 DIAGNOSIS — I10 Essential (primary) hypertension: Secondary | ICD-10-CM | POA: Diagnosis not present

## 2017-04-24 DIAGNOSIS — E119 Type 2 diabetes mellitus without complications: Secondary | ICD-10-CM | POA: Insufficient documentation

## 2017-04-24 HISTORY — PX: EUS: SHX5427

## 2017-04-24 SURGERY — UPPER ENDOSCOPIC ULTRASOUND (EUS) LINEAR
Anesthesia: Monitor Anesthesia Care

## 2017-04-24 MED ORDER — PROPOFOL 10 MG/ML IV BOLUS
INTRAVENOUS | Status: AC
  Filled 2017-04-24: qty 20

## 2017-04-24 MED ORDER — PROPOFOL 500 MG/50ML IV EMUL
INTRAVENOUS | Status: DC | PRN
  Administered 2017-04-24: 75 ug/kg/min via INTRAVENOUS

## 2017-04-24 MED ORDER — SODIUM CHLORIDE 0.9 % IV SOLN: INTRAVENOUS | Status: DC

## 2017-04-24 MED ORDER — LACTATED RINGERS IV SOLN
INTRAVENOUS | Status: DC
  Administered 2017-04-24: 10:00:00 via INTRAVENOUS

## 2017-04-24 MED ORDER — PROPOFOL 10 MG/ML IV BOLUS
INTRAVENOUS | Status: DC | PRN
  Administered 2017-04-24: 40 mg via INTRAVENOUS
  Administered 2017-04-24: 20 mg via INTRAVENOUS
  Administered 2017-04-24: 40 mg via INTRAVENOUS
  Administered 2017-04-24: 20 mg via INTRAVENOUS
  Administered 2017-04-24: 40 mg via INTRAVENOUS
  Administered 2017-04-24: 20 mg via INTRAVENOUS
  Administered 2017-04-24: 40 mg via INTRAVENOUS

## 2017-04-24 NOTE — Anesthesia Postprocedure Evaluation (Signed)
Anesthesia Post Note  Patient: Kenneth Santiago  Procedure(s) Performed: Procedure(s) (LRB): UPPER ENDOSCOPIC ULTRASOUND (EUS) LINEAR (N/A)     Patient location during evaluation: Endoscopy Anesthesia Type: MAC Level of consciousness: awake Pain management: pain level controlled Vital Signs Assessment: post-procedure vital signs reviewed and stable Respiratory status: spontaneous breathing Cardiovascular status: stable Postop Assessment: no signs of nausea or vomiting Anesthetic complications: no    Last Vitals:  Vitals:   04/24/17 1117 04/24/17 1118  BP:  121/66  Pulse: 64   Resp: 16   Temp: 36.8 C     Last Pain:  Vitals:   04/24/17 1117  TempSrc: Oral                 Prairie Stenberg

## 2017-04-24 NOTE — Anesthesia Preprocedure Evaluation (Signed)
Anesthesia Evaluation  Patient identified by MRN, date of birth, ID band Patient awake    Reviewed: Allergy & Precautions, NPO status , Patient's Chart, lab work & pertinent test results  Airway Mallampati: II  TM Distance: >3 FB     Dental   Pulmonary neg pulmonary ROS,    breath sounds clear to auscultation       Cardiovascular hypertension,  Rhythm:Regular Rate:Normal     Neuro/Psych negative neurological ROS     GI/Hepatic Neg liver ROS, PUD, GERD  ,  Endo/Other  diabetesHypothyroidism   Renal/GU negative Renal ROS     Musculoskeletal  (+) Arthritis ,   Abdominal   Peds  Hematology negative hematology ROS (+)   Anesthesia Other Findings   Reproductive/Obstetrics                             Anesthesia Physical Anesthesia Plan  ASA: III  Anesthesia Plan: MAC   Post-op Pain Management:    Induction: Intravenous  PONV Risk Score and Plan: 1 and Ondansetron, Dexamethasone and Propofol infusion  Airway Management Planned: Nasal Cannula  Additional Equipment:   Intra-op Plan:   Post-operative Plan:   Informed Consent: I have reviewed the patients History and Physical, chart, labs and discussed the procedure including the risks, benefits and alternatives for the proposed anesthesia with the patient or authorized representative who has indicated his/her understanding and acceptance.   Dental advisory given  Plan Discussed with: CRNA and Anesthesiologist  Anesthesia Plan Comments:         Anesthesia Quick Evaluation

## 2017-04-24 NOTE — Op Note (Signed)
[facility] Patient Name: Kenneth Santiago Procedure Date: 04/24/2017 MRN: [id number] Attending MD: Milus Banister , MD Date of Birth: [birth year] CSN: [id number] Age: 76 Admit Type: Outpatient Procedure:                Upper EUS Indications:              Duodenal mucosal mass/polyp found on endoscopy:                            presented with UGI bleeding 07/2016 from duodenal                            ulcer of unclear etiology (H. pylori negative by                            gastric biopsies and also serologies; never NSAIDs;                            25 pound weight loss, repeat EGD 11/2016 found                            duodenal stenosis, stricture (biopsies were normal;                            neg for neoplasm or inflammation); also chronic                            diarrhea for 2-3 years Providers:                Milus Banister, MD, Laverta Baltimore RN, RN,                            Elspeth Cho Tech., Technician, Anne Fu CRNA, CRNA Referring MD:             Milton Ferguson, MD Medicines:                Monitored Anesthesia Care Complications:            No immediate complications. Estimated blood loss:                            None. Estimated Blood Loss:     Estimated blood loss: none. Procedure:                Pre-Anesthesia Assessment:                           - Prior to the procedure, a History and Physical                            was performed, and patient medications and                            allergies were  reviewed. The patient's tolerance of                            previous anesthesia was also reviewed. The risks                            and benefits of the procedure and the sedation                            options and risks were discussed with the patient.                            All questions were answered, and informed consent                            was obtained. Prior Anticoagulants:  The patient has                            taken no previous anticoagulant or antiplatelet                            agents. ASA Grade Assessment: III - A patient with                            severe systemic disease. After reviewing the risks                            and benefits, the patient was deemed in                            satisfactory condition to undergo the procedure.                           After obtaining informed consent, the endoscope was                            passed under direct vision. Throughout the                            procedure, the patient's blood pressure, pulse, and                            oxygen saturations were monitored continuously. The                            [id number] ([id number]) scope was introduced through                            the mouth, and advanced to the second part of                            duodenum. The upper EUS was accomplished without  difficulty. The patient tolerated the procedure                            well. Scope In: Scope Out: Findings:      Endoscopic findings:      1. 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 The mucosa       through the stenosis was edematous but not clearly neoplastic appearing.       The previously described 07/2016 ulcer has healed. I was able to advance       the adult gastroscope as well as the radial echoendoscope through the       stenosis with mild resistence. I took several mucosal forcep biopsies       within the stenosis.      2. Moderate amount of solid food retained in the stomach, presumed to be       from incomplete gastric outlet obstruction      3. Otherwise normal UGI tract.      Endosonographic Finding :      1. The duodenum mucosa through the stenosis was non-specifically       thickened without a clear mass lesion.      2. The pancreatic parenchyma in head, neck, body and tail were was       normal appearing. There are no  pancreatic masses.      3. The CBD and main pancreatic duct were normal, non-dilated      4. No peripancreatic, periportal adenopathy.      5. LImited views of liver, spleen, portal and splenic vessels were all       normal. Impression:               - 1-2cm long incomplete stenosis beginning in the                            very distal duodenal bulb. The mucosa was edematous                            but not clearly neoplastic, the previously                            described duodenal ulcer has healed. Forcep                            biopsies were taken from the stenosis.                           - The duodenal wall was non-specifically thick,                            edematous but there were no discrete duodenal                            masses. The pancreatic parenchyma was normal as                            well.                           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 Moderate Sedation:      N/A- Per Anesthesia Care Recommendation:           - Await final pathology from duodenal stenosis                           - I will communicate these results with Dr. Gala Romney Procedure Code(s):        --- Professional ---                           [id number], Esophagogastroduodenoscopy, flexible,                            transoral; with endoscopic ultrasound examination                            limited to the esophagus, stomach or duodenum, and                            adjacent structures Diagnosis Code(s):        --- Professional ---                           K31.89, Other diseases of stomach and duodenum CPT copyright 2016 American Medical Association. All rights reserved. The codes documented in this report are preliminary and upon coder review may  be revised to meet current compliance requirements. Milus Banister, MD 04/24/2017 11:37:26  AM This report has been signed electronically. Number of Addenda: 0

## 2017-04-24 NOTE — Transfer of Care (Signed)
Immediate Anesthesia Transfer of Care Note  Patient: Kenneth Santiago  Procedure(s) Performed: Procedure(s): UPPER ENDOSCOPIC ULTRASOUND (EUS) LINEAR (N/A)  Patient Location: PACU  Anesthesia Type:MAC  Level of Consciousness:  sedated, patient cooperative and responds to stimulation  Airway & Oxygen Therapy:Patient Spontanous Breathing and Patient connected to face mask oxgen  Post-op Assessment:  Report given to PACU RN and Post -op Vital signs reviewed and stable  Post vital signs:  Reviewed and stable  Last Vitals:  Vitals:   04/24/17 1117 04/24/17 1118  BP:  121/66  Pulse: 64   Resp: 16   Temp: 34.3 C     Complications: No apparent anesthesia complications

## 2017-04-24 NOTE — Discharge Instructions (Signed)

## 2017-04-24 NOTE — H&P (Signed)
HPI: This is a 76 yo man  Chief complaint is abnormal duodenum  ROS: complete GI ROS as described in HPI, all other review negative.  Constitutional:  No unintentional weight loss   Past Medical History:  Diagnosis Date  . Abdominal pain   . Actinic keratosis   . Basal cell carcinoma    on face  . Celiac disease    ??? reported h/o positive serologies by PCP remotely  . Diabetes ([place])   . Diverticulosis   . Essential hypertension   . GERD (gastroesophageal reflux disease)   . Glaucoma   . Hyperlipidemia   . Hypothyroidism   . Major depression   . Metabolic syndrome   . Neuropathy   . Osteoarthritis     Past Surgical History:  Procedure Laterality Date  . BACK SURGERY  1987  . BIOPSY N/A 05/25/2015   Procedure: BIOPSY;  Surgeon: Daneil Dolin, MD;  Location: [facility];  Service: Endoscopy;  Laterality: N/A;  right and descending/sigmoid colon  . BIOPSY  08/22/2016   Procedure: BIOPSY;  Surgeon: Daneil Dolin, MD;  Location: [facility] END[facility] SUITE;  Service: Endoscopy;;  gastric     . CATARACT EXTRACTION Bilateral 1988  . CHOLECYSTECTOMY    . COLONOSCOPY     2007 at [facility]  . COLONOSCOPY WITH PROPOFOL N/A 05/25/2015   WNU:UVOZDG anal pipilla otherwise normal/pancolonic diverticulosis  . ESOPHAGOGASTRODUODENOSCOPY     2007 at [facility]  . ESOPHAGOGASTRODUODENOSCOPY (EGD) WITH PROPOFOL N/A 08/22/2016   Procedure: ESOPHAGOGASTRODUODENOSCOPY (EGD) WITH PROPOFOL;  Surgeon: Daneil Dolin, MD;  Location: [facility];  Service: Endoscopy;  Laterality: N/A;  . ESOPHAGOGASTRODUODENOSCOPY (EGD) WITH PROPOFOL N/A 12/12/2016   Procedure: ESOPHAGOGASTRODUODENOSCOPY (EGD) WITH PROPOFOL;  Surgeon: Daneil Dolin, MD;  Location: [facility];  Service: Endoscopy;  Laterality: N/A;  9:15am  . EYES SURGERY Bilateral    retinal detatchment x2    Current Facility-Administered Medications  Medication Dose Route Frequency Provider Last Rate Last Dose  . 0.9 %  sodium chloride infusion    Intravenous Continuous Milus Banister, MD        Allergies as of 04/03/2017 - Review Complete 03/19/2017  Allergen Reaction Noted  . Iodinated diagnostic agents Rash 04/03/2015    Family History  Problem Relation Age of Onset  . CVA Father   . Dementia Mother   . Hyperlipidemia Sister   . Hypothyroidism Sister   . Colon cancer Neg Hx     Social History   Social History  . Marital status: Widowed    Spouse name: N/A  . Number of children: N/A  . Years of education: N/A   Occupational History  . Not on file.   Social History Main Topics  . Smoking status: Never Smoker  . Smokeless tobacco: Never Used  . Alcohol use No  . Drug use: No  . Sexual activity: Not Currently    Birth control/ protection: None   Other Topics Concern  . Not on file   Social History Narrative  . No narrative on file     Physical Exam: Pulse 61   Temp 98.1 F (36.7 C) (Oral)   Resp 17   Ht 5\' 11"  (1.803 m)   Wt 178 lb (80.7 kg)   SpO2 99%   BMI 24.83 kg/m  Constitutional: generally well-appearing Psychiatric: alert and oriented x3 Abdomen: soft, nontender, nondistended, no obvious ascites, no peritoneal signs, normal bowel sounds No peripheral edema noted in lower extremities  Assessment and plan:  76 y.o. male with abnormal duodenum  For upper EUS today  Please see the "Patient Instructions" section for addition details about the plan.  Owens Loffler, MD [facility] 04/24/2017, 10:34 AM

## 2017-04-25 ENCOUNTER — Encounter (HOSPITAL_COMMUNITY): Payer: Self-pay | Admitting: Gastroenterology

## 2017-04-28 DIAGNOSIS — K210 Gastro-esophageal reflux disease with esophagitis: Secondary | ICD-10-CM | POA: Diagnosis not present

## 2017-04-28 DIAGNOSIS — E1165 Type 2 diabetes mellitus with hyperglycemia: Secondary | ICD-10-CM | POA: Diagnosis not present

## 2017-04-28 DIAGNOSIS — E039 Hypothyroidism, unspecified: Secondary | ICD-10-CM | POA: Diagnosis not present

## 2017-04-28 DIAGNOSIS — I10 Essential (primary) hypertension: Secondary | ICD-10-CM | POA: Diagnosis not present

## 2017-04-28 DIAGNOSIS — E782 Mixed hyperlipidemia: Secondary | ICD-10-CM | POA: Diagnosis not present

## 2017-05-06 DIAGNOSIS — I10 Essential (primary) hypertension: Secondary | ICD-10-CM | POA: Diagnosis not present

## 2017-05-06 DIAGNOSIS — E039 Hypothyroidism, unspecified: Secondary | ICD-10-CM | POA: Diagnosis not present

## 2017-05-06 DIAGNOSIS — Z1389 Encounter for screening for other disorder: Secondary | ICD-10-CM | POA: Diagnosis not present

## 2017-05-06 DIAGNOSIS — K219 Gastro-esophageal reflux disease without esophagitis: Secondary | ICD-10-CM | POA: Diagnosis not present

## 2017-05-06 DIAGNOSIS — E1165 Type 2 diabetes mellitus with hyperglycemia: Secondary | ICD-10-CM | POA: Diagnosis not present

## 2017-05-06 DIAGNOSIS — Z0001 Encounter for general adult medical examination with abnormal findings: Secondary | ICD-10-CM | POA: Diagnosis not present

## 2017-05-08 ENCOUNTER — Other Ambulatory Visit: Payer: Self-pay | Admitting: *Deleted

## 2017-05-08 NOTE — Patient Outreach (Signed)
[facility]) Care Management  05/08/2017  Kenneth Santiago May 16, 1941 [id number]   Telephone Screen  Referral Date: 05/08/17 Referral Source: [place] (Dr. Gar Ponto) Referral Reason: Social evaluation and prescription assistance Insurance: [facility] Medicare  Outreach attempt # 1 to patient's daughter Kenneth Santiago). HIPAA verified with Kenneth Santiago.  Social:  Patient moved with his daughter and grandchildren in June 2018. He requires a significant amount of encouragement to bathe and dress himself, per [facility]. Kenneth Santiago provides transportation for patient to all of his medical appointments. Kenneth Santiago is concerned about having consistent caregiver(s), when she is working. Patient has a private paid caregiver from 10 am to 2 pm, currently.   Conditions: Past Medical Hx: DM (diet controlled), Basal Cell Carcinoma, Diverticulosis, HTN, GERD, HLD, Hypothyroidism, Glaucoma, Depression, Metabolic Syndrome, Osteoarthritis Daughter reported, patient has been living with her since June 2018. She stated, patient lost 30 pounds, since Christmas. He has gained 8 pounds, since he moved with her. Patient had a Thyroid and Hernia procedure to assist with his digestion complications.  Patient was diagnosed with moderate Dementia, per daughter. "He is very forgetful, however he tends to hold steady to his thoughts/ideals". He misinterprets actual reality to distorted reality, per [facility].Marland Kitchen "Patient is hard to gauge from one day to the next". His days are better, when he takes his medications as prescribed, per [facility].    Medications: Patient takes 9 medications per day. He can afford all of his medications. Patient takes his medications as prescribed, with assistance from his daughter.  Appointments: Patient's last PCP appointment was 05/06/17.  Advanced Directives: Kenneth Santiago reported, patient has a Living Will and HPOA.  Consent: [facility] services reviewed and discussed  with spouse. Kenneth Santiago agreed to services.   Plan: RN CM advised patient to contact RNCM for any needs or concerns. RN CM will send [facility] referral for possible assistance with community resources.  Lake Bells, RN, BSN, MHA/MSL, [place] Telephonic Care Manager Coordinator [facility] Direct Phone: [phone] Toll Free: [phone] Fax: [phone]

## 2017-05-09 ENCOUNTER — Encounter: Payer: Self-pay | Admitting: Licensed Clinical Social Worker

## 2017-05-09 ENCOUNTER — Other Ambulatory Visit: Payer: Self-pay | Admitting: Licensed Clinical Social Worker

## 2017-05-09 NOTE — Patient Outreach (Signed)
Assessment:  CSW received referral on Kenneth Santiago. CSW completed chart review on client on 05/09/17.  Client sees Dr. Gar Ponto as primary care doctor.  Client currently resides at home of his daughter.  His daughter, Kenneth Santiago, does work and is in need of information about community resources for in home care support for client. Client is sometimes forgetful ( per information from daughter of client).  Daughter has reported that client needs help with some activities of daily living.  Daughter of client provides client transportation to and from client's medical appointments. Client has been diagnosed with Dementia.  His daughter assists client in taking prescribed medications as scheduled for client. CSW spoke via phone with Kenneth Santiago, daughter of client, on 05/09/17. CSW verified identity of Kenneth Santiago.  Kenneth Santiago is Health Care Power of Attorney for client, Kenneth Santiago gave CSW verbal permission on 05/09/17 for CSW to speak with her regarding current needs and status of client. Kenneth Santiago said she is currently paying a caregiver to assist with care needs of client each week. She said it is somewhat difficult to pay for caregiver costs buts caregiver helps with client needs from 10:00 AM to 2:00 PM on weekdays. She said she would like to find a volunteer or other caregiver who could assist with care needs of client for about two hours daily. CSW and Kenneth Santiago completed needed [facility] assessments for client. CSW informed Kenneth Santiago regarding Aging[facility] Disability [facility] Transient Services in [place], [place] and in home services through that agency. Kenneth Santiago said it would be difficult to pay for another hourly caregiver through that agency currently. 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  She said that client resides at her home with her and with her two children.  CSW spoke with Kenneth Santiago about client care plan. CSW encouraged that client or Kenneth Santiago  communicate with CSW in next 30 days to discuss community resources for in home support/assistance for client. CSW spoke with Kenneth Santiago about family members support group for [facility] patients in [place], [place]. She said she appreciated information but mainly wanted to seek additional in home caregiver support for a few hours daily for client. CSW thanked Kenneth Santiago for phone conversation with CSW on 05/09/17. CSW encouraged Kenneth Santiago or client to call CSW at [phone] as needed to discuss social work needs of client.      Plan:  Client or Kenneth Santiago to communicate with CSW in next 30 days to discuss community resources for in home support/assistance for client.  CSW to call client or Kenneth Santiago, daughter of client, in two weeks to assess client needs at that time.  Norva Riffle.Dani Wallner MSW, LCSW Licensed Clinical Social Worker [facility] Care Management [phone]

## 2017-05-14 DIAGNOSIS — K4090 Unilateral inguinal hernia, without obstruction or gangrene, not specified as recurrent: Secondary | ICD-10-CM | POA: Insufficient documentation

## 2017-05-23 ENCOUNTER — Other Ambulatory Visit: Payer: Self-pay | Admitting: Licensed Clinical Social Worker

## 2017-05-23 NOTE — Patient Outreach (Signed)
Assessment:  CSW spoke via phone with Kenneth Santiago, daughter of client. CSW verified identity of Kenneth Santiago.. CSW received verbal permission from Kenneth Santiago  on 05/23/17 for CSW to communicate with Kenneth Santiago about current client needs and status. Kenneth Santiago is Kenneth Santiago for client. Client sees Dr. Gar Ponto as primary care doctor. Client resides with his daughter. Client sometimes needs help with activities of daily living.  Kenneth Santiago, daughter of client transports client to and from client's scheduled medical appointments. Client has been diagnosed with Dementia. Kenneth Santiago has been seeking information about in home care support services in the area.  Client has prescribed medications. Daughter of client helps client to take medications as prescribed for client. Kenneth Santiago has reported that she currently pays a care giver to assist client, as scheduled weekly, in the home.  Kenneth Santiago has said that she would like to also find a volunteer or other caregiver to assist client for two hours daily (on weekdays). CSW has informed Kenneth Santiago regarding Aging, Disability and Transient Services in [place], [place] and in home support services available through that agency.  Kenneth Santiago is a Education officer, museum and has job responsibilities during school hours.  CSW spoke with Kenneth Santiago about client care plan. CSW encouraged that client or Kenneth Santiago communicate with CSW in next 30 days to discuss community resources for in home assistance for client. CSW had also spoken with Kenneth Santiago about [facility] through [place]. CSW had given Kenneth Santiago phone number for [employer] at [phone]. [phone].  CSW had encouraged Kenneth Santiago to call number for [employer] to speak with representative of that program about support services through [facility] program.  CSW has given Kenneth Santiago phone number of [phone]. CSW has encouraged Kenneth Santiago or client to  call CSW at [phone] as needed to discuss social work needs of client.    Plan:  Client or Kenneth Santiago to communicate with CSW in next 30 days to discuss community resources for in home assistance for client.   CSW to call client or Kenneth Santiago, daughter of client, in 3 weeks to assess client needs at that time.  Kenneth Santiago MSW, LCSW Licensed Clinical Social Worker [facility] Care Management [phone]

## 2017-06-10 DIAGNOSIS — I10 Essential (primary) hypertension: Secondary | ICD-10-CM | POA: Diagnosis not present

## 2017-06-10 DIAGNOSIS — K210 Gastro-esophageal reflux disease with esophagitis: Secondary | ICD-10-CM | POA: Diagnosis not present

## 2017-06-10 DIAGNOSIS — E1165 Type 2 diabetes mellitus with hyperglycemia: Secondary | ICD-10-CM | POA: Diagnosis not present

## 2017-06-10 DIAGNOSIS — E782 Mixed hyperlipidemia: Secondary | ICD-10-CM | POA: Diagnosis not present

## 2017-06-10 DIAGNOSIS — E039 Hypothyroidism, unspecified: Secondary | ICD-10-CM | POA: Diagnosis not present

## 2017-06-12 DIAGNOSIS — I10 Essential (primary) hypertension: Secondary | ICD-10-CM | POA: Diagnosis not present

## 2017-06-12 DIAGNOSIS — Z23 Encounter for immunization: Secondary | ICD-10-CM | POA: Diagnosis not present

## 2017-06-12 DIAGNOSIS — E039 Hypothyroidism, unspecified: Secondary | ICD-10-CM | POA: Diagnosis not present

## 2017-06-12 DIAGNOSIS — E1165 Type 2 diabetes mellitus with hyperglycemia: Secondary | ICD-10-CM | POA: Diagnosis not present

## 2017-06-12 DIAGNOSIS — E782 Mixed hyperlipidemia: Secondary | ICD-10-CM | POA: Diagnosis not present

## 2017-06-16 ENCOUNTER — Other Ambulatory Visit: Payer: Self-pay | Admitting: Licensed Clinical Social Worker

## 2017-06-16 NOTE — Patient Outreach (Signed)
Assessment;  CSW spoke via phone with  Kenneth Santiago, daughter of client on 06/16/17.   CSW verified identity of Kenneth Santiago. CSW received verbal permission from Kenneth Santiago on 06/16/17 for CSW to communicate with Kenneth Santiago regarding current client needs and status. Client sees Dr. Quillian Quince as primary care doctor. Client is trying to attend scheduled client medical appointments. Client has prescribed medications and is taking medications as prescribed. Kenneth Santiago, daughter of client, is [place] of Attorney for client. Kenneth Santiago has reported to CSW that client does need some assistance with activities of daily living.  Client currently has a care giver who assists him daily as scheduled in the home.  Kenneth Santiago transports client to and from client's scheduled medical appointments.  CSW has given Kenneth Santiago information about Aging, Disability and Transient Services agency in [place], [place].  [place] has also spoken with Kenneth Santiago about [employer] with [employer]. CSW has provided Kenneth Santiago with phone number for [facility].  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 Kenneth Santiago is a Education officer, museum and thus has job responsibilities. CSW has encouraged that client or Kenneth Santiago communicate with CSW in next 30 days to discuss community resources for in home assistance for client. CSW encouraged Kenneth Santiago to call CSW at [phone] as needed to discuss social work needs of client.   Plan:  Client or Kenneth Santiago to communicate with CSW in next 30 days to discuss community resources for in home assistance for client.  CSW to call client or Kenneth Santiago in 4 weeks to assess client needs at that time.  Kenneth Santiago.Kenneth Santiago MSW, LCSW Licensed Clinical Social Worker [facility] Care Management [phone]

## 2017-07-15 ENCOUNTER — Other Ambulatory Visit: Payer: Self-pay | Admitting: Licensed Clinical Social Worker

## 2017-07-15 NOTE — Patient Outreach (Signed)
Assessment:  CSW spoke via phone with Kenneth Santiago, daughter of client. CSW verified identity of Kenneth Santiago. CSW received verbal permission from Kenneth Santiago on 07/15/17 for CSW to speak with Kenneth Santiago regarding current client needs and status.  Client sees Dr. Gar Ponto as primary care doctor. Client is trying to attend medical appointments as scheduled. Client has prescribed medications and is taking medications as prescribed. Kenneth Santiago, daughter of client, is [name] of Attorney for client. Client does need some assistance with activities of daily living. Client currently has a caregiver who assists him daily as scheduled in the home. Kenneth Santiago transports client to and from client's scheduled medical appointments. CSW has given Kenneth Santiago information about Aging, Disability and Transient Services agency in [place], [place]. [place] has also spoken with Kenneth Santiago about [employer] with [employer]. CSW has provided Kenneth Santiago with phone number for [facility]. Client has Alzheimer's dementia.  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 CSW encouraged that client or Kenneth Santiago communicate with CSW in the next 30 days to discuss community resources for in home assistance for client. CSW encouraged that client or Kenneth Santiago call CSW at [phone] as needed to discuss social work needs of client.    Plan:  Client or Kenneth Santiago to communicate with CSW in the next 30 days to discuss community resources for in home assistance for client.  CSW to call client or Kenneth Santiago in 4 weeks to assess client needs at that time.   Norva Riffle.Keonna Raether MSW, LCSW Licensed Clinical Social Worker [facility] Care Management [phone]

## 2017-08-11 DIAGNOSIS — I10 Essential (primary) hypertension: Secondary | ICD-10-CM | POA: Diagnosis not present

## 2017-08-11 DIAGNOSIS — E1165 Type 2 diabetes mellitus with hyperglycemia: Secondary | ICD-10-CM | POA: Diagnosis not present

## 2017-08-11 DIAGNOSIS — E039 Hypothyroidism, unspecified: Secondary | ICD-10-CM | POA: Diagnosis not present

## 2017-08-11 DIAGNOSIS — E782 Mixed hyperlipidemia: Secondary | ICD-10-CM | POA: Diagnosis not present

## 2017-08-13 ENCOUNTER — Other Ambulatory Visit: Payer: Self-pay | Admitting: Licensed Clinical Social Worker

## 2017-08-13 NOTE — Patient Outreach (Signed)
Assessment:  CSW spoke via phone with Kenneth Santiago. CSW verified identity of Kenneth Santiago.   CSW received verbal permission from Kenneth Santiago on 08/13/17 for CSW to speak with Kenneth Santiago regarding client needs and status. Client has prescribed medications and is taking medications as prescribed. Client sees Dr. Gar Ponto as primary care doctor. Client did have a medical appointment this week with Dr. Gar Ponto. Client is scheduled to see Dr. Quillian Quince again in January of 2019.   Kenneth Santiago, daughter of client, is Francis of Attorney for client.  Client needs some assistance with activities of daily living.  Client currently has a caregiver who assists him daily as scheduled in the home.  Kenneth Santiago transports client to and from client's scheduled medical appointments. Client has Alzheimer's dementia.  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  CSW encouraged that client or Kenneth Santiago communicate with  CSW in the next 30 days to discuss community resources for in home assistance for client.  CSW spoke with Kenneth Santiago about Aging, Disability and Transient Services in [place], [place] and in home assistance (private pay) through that agency. Kenneth Santiago said she is currently private paying a caregiver for assisting client in the home weekly as scheduled.  CSW spoke with Kenneth Santiago about [facility] program support in nursing, social work and pharmacy.  CSW invited Kenneth Santiago or client to call CSW at [phone] as needed to discuss social work needs of client. CSW thanked Kenneth Santiago for phone call with CSW on 08/13/17.   Plan:  Client or Kenneth Santiago to communicate with CSW in the next 30 days to discuss community resources for in home assistance for client.  CSW to call client or Kenneth Santiago in 4 weeks to assess client needs at that time.   Norva Riffle.Sani Madariaga MSW, LCSW Licensed Clinical Social Worker [facility] Care Management [phone]

## 2017-08-26 DIAGNOSIS — N390 Urinary tract infection, site not specified: Secondary | ICD-10-CM | POA: Diagnosis not present

## 2017-08-26 DIAGNOSIS — Z79899 Other long term (current) drug therapy: Secondary | ICD-10-CM | POA: Diagnosis not present

## 2017-08-26 DIAGNOSIS — H409 Unspecified glaucoma: Secondary | ICD-10-CM | POA: Diagnosis not present

## 2017-08-27 DIAGNOSIS — G301 Alzheimer's disease with late onset: Secondary | ICD-10-CM | POA: Diagnosis not present

## 2017-09-12 ENCOUNTER — Other Ambulatory Visit: Payer: Self-pay | Admitting: Licensed Clinical Social Worker

## 2017-09-12 NOTE — Patient Outreach (Signed)
Assessment:  CSW spoke via phone with Kenneth Santiago. CSW verified identity of Kenneth Santiago. CSW received verbal permission from Kenneth Santiago on 09/12/17 for CSW to speak with Kenneth Santiago regarding client needs and status. Client sees Dr. Quillian Quince as primary care doctor. Client is scheduled to see Dr. Quillian Quince for appointment in January of 2019.   Client has prescribed medications and is taking medications as prescribed. Kenneth Santiago is Sims of Attorney for client. Client does need some assistance with activities of daily living.Client currently has a caregiver who assists him daily as scheduled in the home. Kenneth Santiago transports client to and from client's scheduled medical appointments. Client has Alzheimer's dementia.  Regarding client care plan, CSW has encouraged that client or Kenneth Santiago communicate with CSW in the next 30 days to discuss community resources for in home assistance for client. CSW has talked with Kenneth Santiago about Aging, Disability, and Transient Services in [place], [place] and in home assistance (private pay) through that agency.  CSW encouraged that client or Kenneth Santiago please call CSW at [phone] as needed to discuss social work needs of client. Kenneth Santiago has said that in home care worker for client is very helpful to client each week.  Plan:  Client or Kenneth Santiago to communicate with CSW in next 30 days to discuss community resources for in home assistance for client.   CSW to call client or Kenneth Santiago in 4 weeks to assess client needs at that time.  Norva Riffle.Korayma Hagwood MSW, LCSW Licensed Clinical Social Worker [facility] Care Management [phone]

## 2017-09-30 DIAGNOSIS — Z6824 Body mass index (BMI) 24.0-24.9, adult: Secondary | ICD-10-CM | POA: Diagnosis not present

## 2017-09-30 DIAGNOSIS — G301 Alzheimer's disease with late onset: Secondary | ICD-10-CM | POA: Diagnosis not present

## 2017-10-15 ENCOUNTER — Other Ambulatory Visit: Payer: Self-pay | Admitting: Licensed Clinical Social Worker

## 2017-10-15 NOTE — Patient Outreach (Signed)
Assessment:  CSW spoke via phone with Kenneth Santiago, daughter of client. CSW verified identity of Kenneth Santiago. CSW received verbal permission from Kenneth Santiago on 10/15/17 for CSW to speak with Kenneth Santiago about current client needs and status. Client sees Dr. Quillian Quince as primary care doctor.  Client has prescribed medications and is taking medications as prescribed.  Client does need some assistance with activities of daily living.   Client currently has a caregiver who assists him daily as scheduled in the home. Kenneth Santiago transports client to and from client's scheduled medical appointments.  CSW has previously talked with Olean Ree about Aging, Disability and Transient Services agency in [place], [place]. [place] has also spoken with Olean Ree about [employer] with [employer]  CSW has provided Olean Ree with phone number for [facility].  Client has Alzheimer's dementia. CSW has talked with Kenneth Santiago about client care plan. CSW has encouraged that client or Olean Ree communicate with CSW in next 30 days to discuss community resources for in home assistance for client.  CSW has provided Kenneth Santiago the [place] phone number of [phone]. CSW has encouraged Kenneth Santiago to call CSW at that number to discuss social work needs of client.         Plan:   Client or Kenneth Santiago to communicate with CSW in the next 30 days to discuss community resources for in home assistance for client.  CSW to call client or Kenneth Santiago in 4 weeks to assess client needs at that time.  Norva Riffle.Jaymz Traywick MSW, LCSW Licensed Clinical Social Worker [facility] Care Management [phone]

## 2017-11-07 DIAGNOSIS — J209 Acute bronchitis, unspecified: Secondary | ICD-10-CM | POA: Diagnosis not present

## 2017-11-17 DIAGNOSIS — E782 Mixed hyperlipidemia: Secondary | ICD-10-CM | POA: Diagnosis not present

## 2017-11-17 DIAGNOSIS — E1165 Type 2 diabetes mellitus with hyperglycemia: Secondary | ICD-10-CM | POA: Diagnosis not present

## 2017-11-17 DIAGNOSIS — E039 Hypothyroidism, unspecified: Secondary | ICD-10-CM | POA: Diagnosis not present

## 2017-11-18 ENCOUNTER — Encounter: Payer: Self-pay | Admitting: Licensed Clinical Social Worker

## 2017-11-18 ENCOUNTER — Other Ambulatory Visit: Payer: Self-pay | Admitting: Licensed Clinical Social Worker

## 2017-11-18 NOTE — Patient Outreach (Signed)
Assessment:  CSW spoke via phone with Kenneth Santiago. CSW verified identity of Kenneth Santiago. CSW received verbal permission from Kenneth Santiago  on 11/18/17 for CSW to speak with Kenneth Santiago  about client needs.  Client resides at home and receives care in the home weekly as needed. Kenneth Santiago transports client to and from client's medical appointments. Client has prescribed medications and is taking medications as prescribed. CSW informed Kenneth Santiago on 11/18/17 that client had met his care plan goals with [facility] CSW services. Thus, CSW informed Kenneth Santiago on 11/18/17 that [place] would discharge client form [facility] CSW services on 11/18/17 since client had met his care plan goals.  CSW thanked Kenneth Santiago and client for working with [facility] program support in recent months.    Plan:  CSW is discharging Kenneth Santiago from [facility] CSW services on 11/18/17 since client has met client's care plan goals.  CSW to inform Kenneth Santiago , Case Management Assistant, that [place] discharged client on 11/18/17 from [place] services.  CSW to fax physician's case closure letter to Kenneth Santiago informing Kenneth Santiago that [place] discharged client on 11/18/17 from [facility] CSW services.  Norva Riffle.Takeysha Bonk MSW, LCSW Licensed Clinical Social Worker [facility] Care Management [phone]

## 2017-11-20 DIAGNOSIS — E1165 Type 2 diabetes mellitus with hyperglycemia: Secondary | ICD-10-CM | POA: Diagnosis not present

## 2017-11-20 DIAGNOSIS — E782 Mixed hyperlipidemia: Secondary | ICD-10-CM | POA: Diagnosis not present

## 2017-11-20 DIAGNOSIS — E039 Hypothyroidism, unspecified: Secondary | ICD-10-CM | POA: Diagnosis not present

## 2017-11-20 DIAGNOSIS — G301 Alzheimer's disease with late onset: Secondary | ICD-10-CM | POA: Diagnosis not present

## 2017-12-02 DIAGNOSIS — M25512 Pain in left shoulder: Secondary | ICD-10-CM | POA: Diagnosis not present

## 2017-12-02 DIAGNOSIS — M25521 Pain in right elbow: Secondary | ICD-10-CM | POA: Diagnosis not present

## 2017-12-12 DIAGNOSIS — M25511 Pain in right shoulder: Secondary | ICD-10-CM | POA: Diagnosis not present

## 2017-12-12 DIAGNOSIS — M25521 Pain in right elbow: Secondary | ICD-10-CM | POA: Diagnosis not present

## 2017-12-12 DIAGNOSIS — Z7984 Long term (current) use of oral hypoglycemic drugs: Secondary | ICD-10-CM | POA: Diagnosis not present

## 2017-12-12 DIAGNOSIS — W1830XD Fall on same level, unspecified, subsequent encounter: Secondary | ICD-10-CM | POA: Diagnosis not present

## 2017-12-12 DIAGNOSIS — M79622 Pain in left upper arm: Secondary | ICD-10-CM | POA: Diagnosis not present

## 2017-12-12 DIAGNOSIS — R262 Difficulty in walking, not elsewhere classified: Secondary | ICD-10-CM | POA: Diagnosis not present

## 2017-12-16 DIAGNOSIS — W1830XD Fall on same level, unspecified, subsequent encounter: Secondary | ICD-10-CM | POA: Diagnosis not present

## 2017-12-16 DIAGNOSIS — M25511 Pain in right shoulder: Secondary | ICD-10-CM | POA: Diagnosis not present

## 2017-12-16 DIAGNOSIS — M25521 Pain in right elbow: Secondary | ICD-10-CM | POA: Diagnosis not present

## 2017-12-16 DIAGNOSIS — R262 Difficulty in walking, not elsewhere classified: Secondary | ICD-10-CM | POA: Diagnosis not present

## 2017-12-16 DIAGNOSIS — M79622 Pain in left upper arm: Secondary | ICD-10-CM | POA: Diagnosis not present

## 2017-12-16 DIAGNOSIS — Z7984 Long term (current) use of oral hypoglycemic drugs: Secondary | ICD-10-CM | POA: Diagnosis not present

## 2017-12-19 DIAGNOSIS — M25511 Pain in right shoulder: Secondary | ICD-10-CM | POA: Diagnosis not present

## 2017-12-19 DIAGNOSIS — M25521 Pain in right elbow: Secondary | ICD-10-CM | POA: Diagnosis not present

## 2017-12-19 DIAGNOSIS — Z7984 Long term (current) use of oral hypoglycemic drugs: Secondary | ICD-10-CM | POA: Diagnosis not present

## 2017-12-19 DIAGNOSIS — R262 Difficulty in walking, not elsewhere classified: Secondary | ICD-10-CM | POA: Diagnosis not present

## 2017-12-19 DIAGNOSIS — M79622 Pain in left upper arm: Secondary | ICD-10-CM | POA: Diagnosis not present

## 2017-12-19 DIAGNOSIS — W1830XD Fall on same level, unspecified, subsequent encounter: Secondary | ICD-10-CM | POA: Diagnosis not present

## 2017-12-22 DIAGNOSIS — M25521 Pain in right elbow: Secondary | ICD-10-CM | POA: Diagnosis not present

## 2017-12-22 DIAGNOSIS — R262 Difficulty in walking, not elsewhere classified: Secondary | ICD-10-CM | POA: Diagnosis not present

## 2017-12-22 DIAGNOSIS — Z7984 Long term (current) use of oral hypoglycemic drugs: Secondary | ICD-10-CM | POA: Diagnosis not present

## 2017-12-22 DIAGNOSIS — W1830XD Fall on same level, unspecified, subsequent encounter: Secondary | ICD-10-CM | POA: Diagnosis not present

## 2017-12-22 DIAGNOSIS — M79622 Pain in left upper arm: Secondary | ICD-10-CM | POA: Diagnosis not present

## 2017-12-22 DIAGNOSIS — M25511 Pain in right shoulder: Secondary | ICD-10-CM | POA: Diagnosis not present

## 2017-12-25 DIAGNOSIS — M25511 Pain in right shoulder: Secondary | ICD-10-CM | POA: Diagnosis not present

## 2017-12-25 DIAGNOSIS — R262 Difficulty in walking, not elsewhere classified: Secondary | ICD-10-CM | POA: Diagnosis not present

## 2017-12-25 DIAGNOSIS — M25521 Pain in right elbow: Secondary | ICD-10-CM | POA: Diagnosis not present

## 2017-12-25 DIAGNOSIS — M79622 Pain in left upper arm: Secondary | ICD-10-CM | POA: Diagnosis not present

## 2017-12-25 DIAGNOSIS — Z7984 Long term (current) use of oral hypoglycemic drugs: Secondary | ICD-10-CM | POA: Diagnosis not present

## 2017-12-25 DIAGNOSIS — W1830XD Fall on same level, unspecified, subsequent encounter: Secondary | ICD-10-CM | POA: Diagnosis not present

## 2017-12-31 DIAGNOSIS — J00 Acute nasopharyngitis [common cold]: Secondary | ICD-10-CM | POA: Diagnosis not present

## 2018-01-20 IMAGING — DX DG CHEST 2V
2 series · 2 of 2 positions shown · non-contrast
Comparison: Report from 01/21/2014

CLINICAL DATA: Left upper quadrant pain, weakness and dyspnea.

EXAM:
CHEST  2 VIEW

[chest lat]
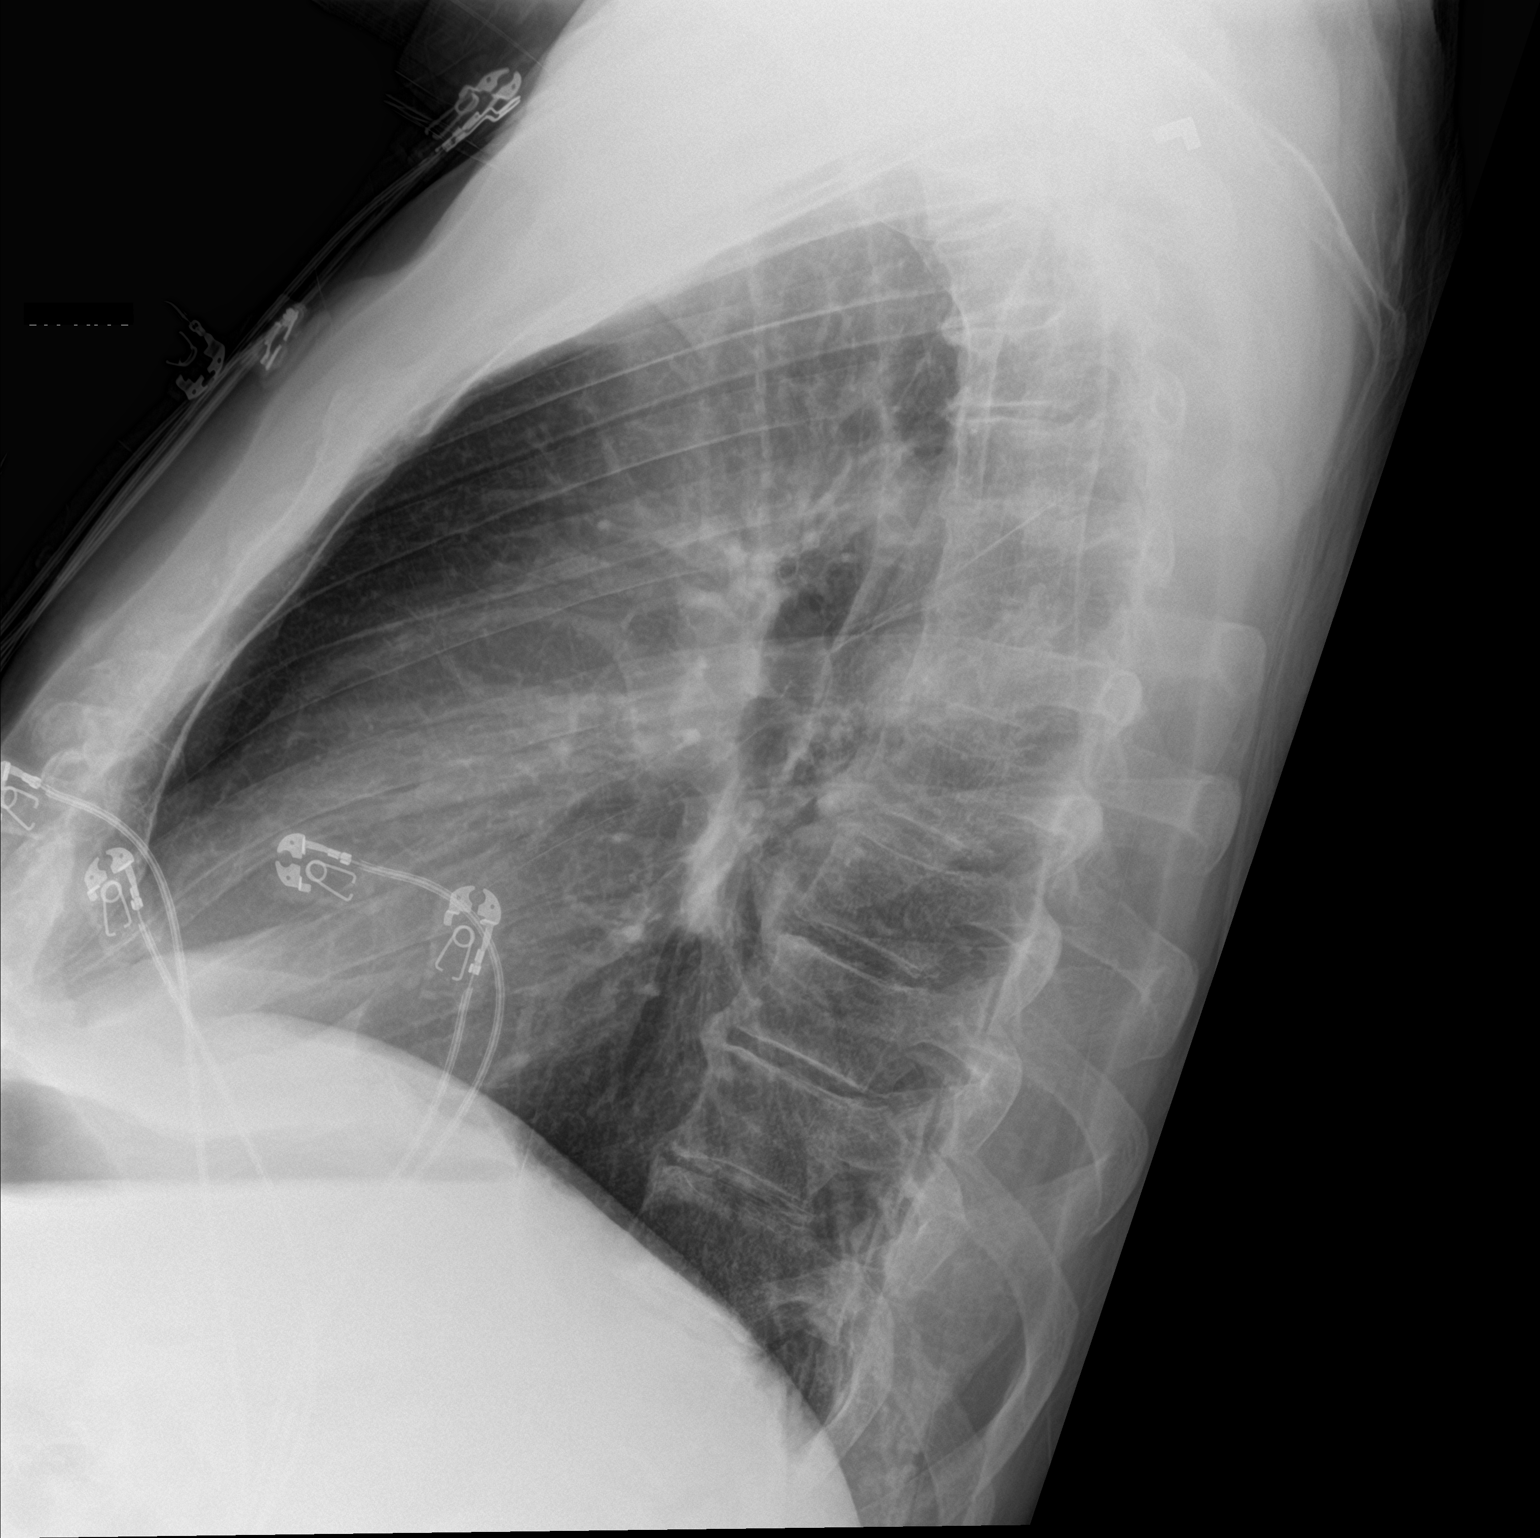

[chest ap]
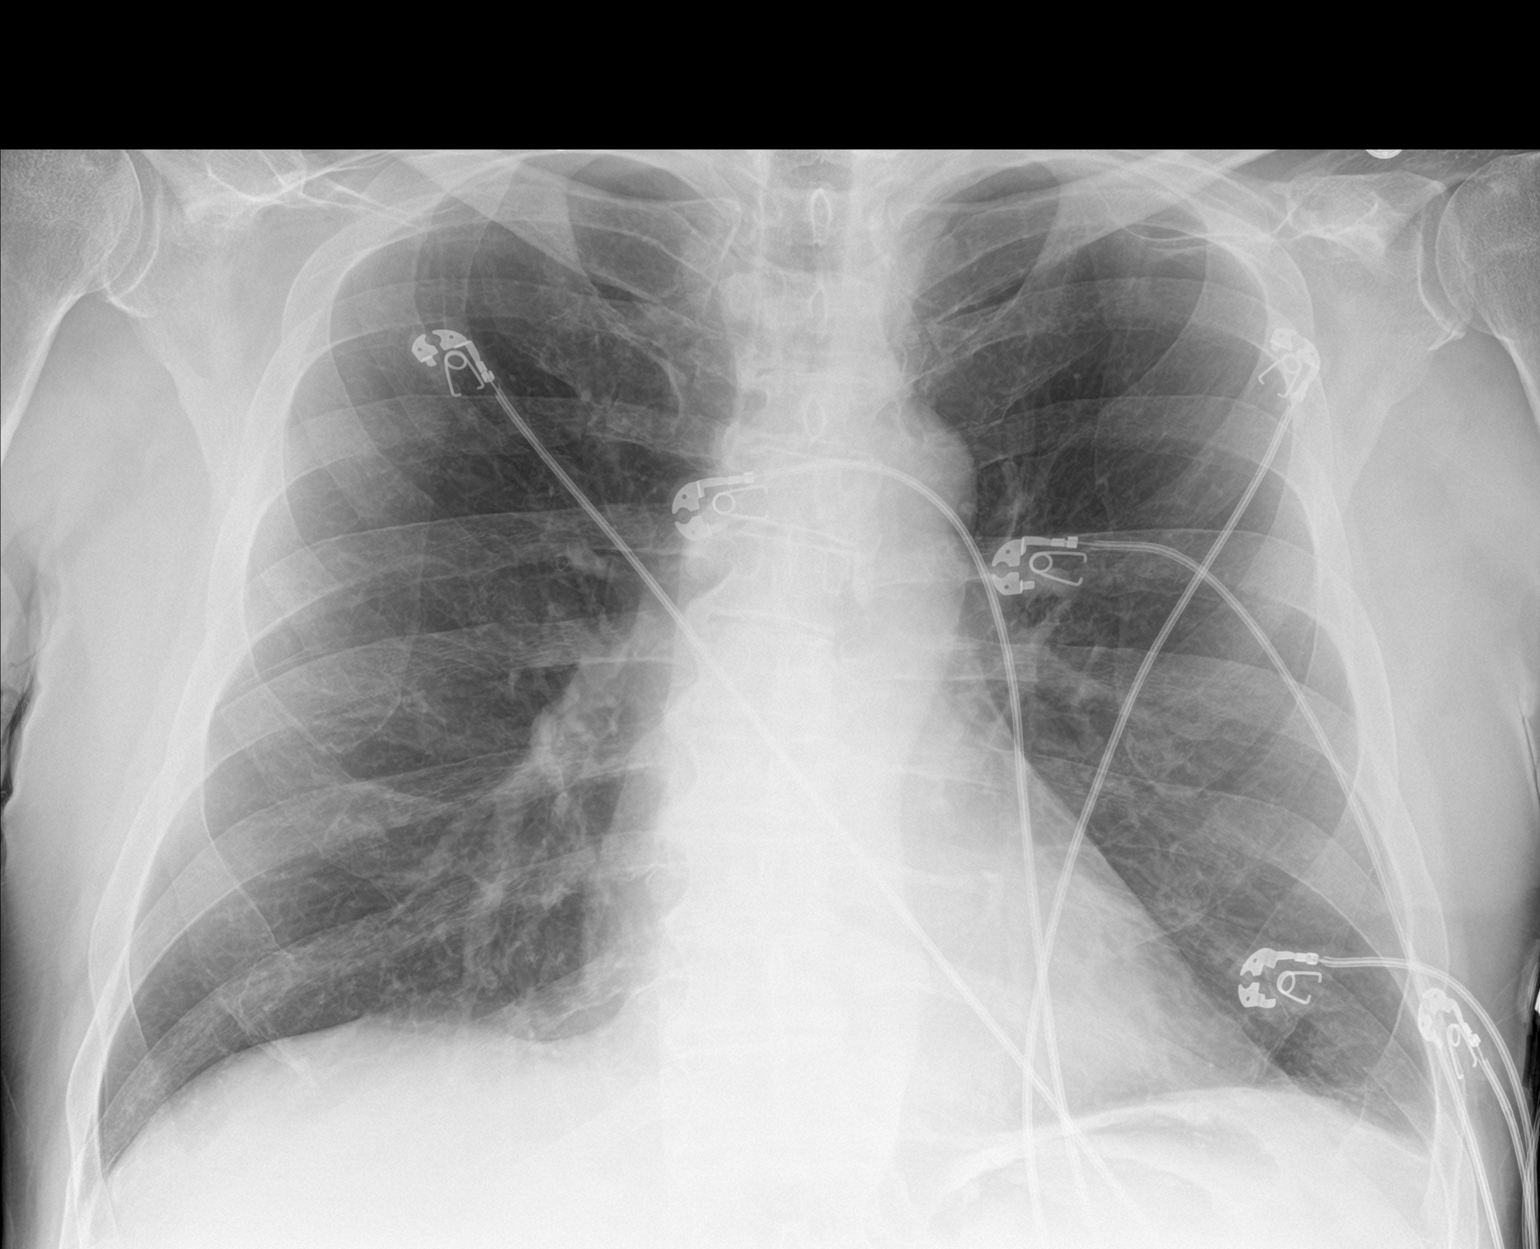

[2 of 2 positions shown; findings below may reference images not displayed]

FINDINGS: The heart is top-normal in size. The thoracic aorta is slightly
tortuous without definite aneurysm. And mild hyperinflation of the
lungs without pneumonic consolidation, CHF nor effusion. There is
osteoarthritic spurring about the AC joints. Small anterior
osteophytes are seen along the dorsal spine. No acute osseous
abnormality.
IMPRESSION: Mild emphysematous hyperinflation of the lungs without acute
pulmonary disease.

## 2018-01-20 IMAGING — CT CT ABD-PELV W/O CM
2 of 4 series · 15 of 46 positions shown, 17 images · non-contrast
Comparison: None.

CLINICAL DATA: Acute onset of left upper quadrant abdominal pain
and melena. Generalized weakness and shortness of breath. Initial
encounter.

EXAM:
CT ABDOMEN AND PELVIS WITHOUT CONTRAST
TECHNIQUE: Multidetector CT imaging of the abdomen and pelvis was performed
following the standard protocol without IV contrast.

[Series 2: axial st · axial · 0.92mm/px · z∈[-588,-103]mm · 12 of 107 slices shown, 14 images]
[im 5/107  soft-tissue]
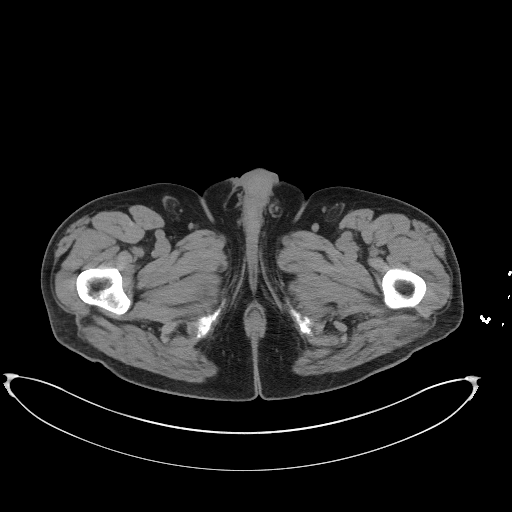
[im 5/107  bone]
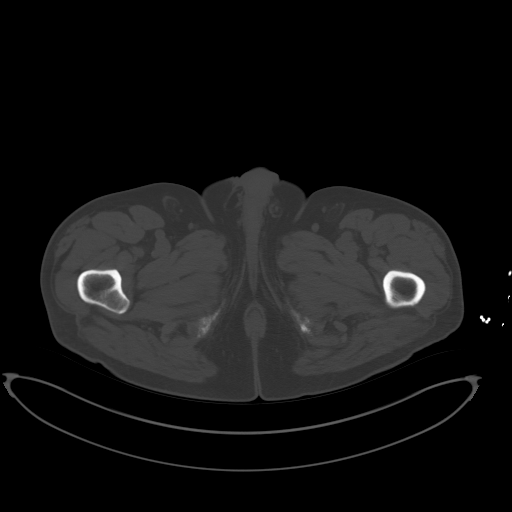
[im 14/107  soft-tissue]
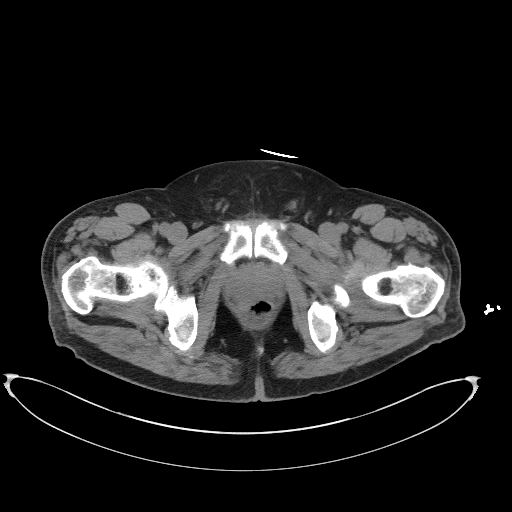
[im 24/107  soft-tissue]
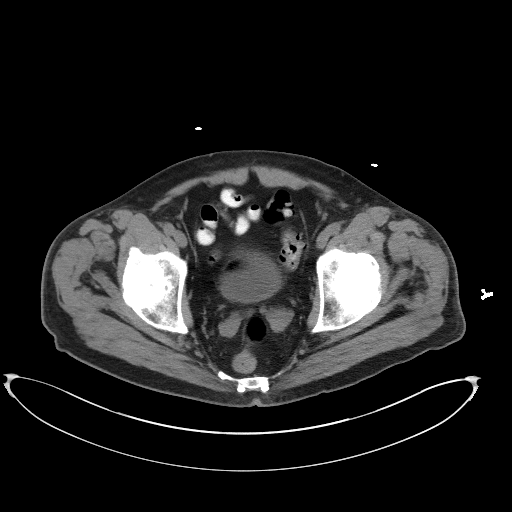
[im 33/107  soft-tissue]
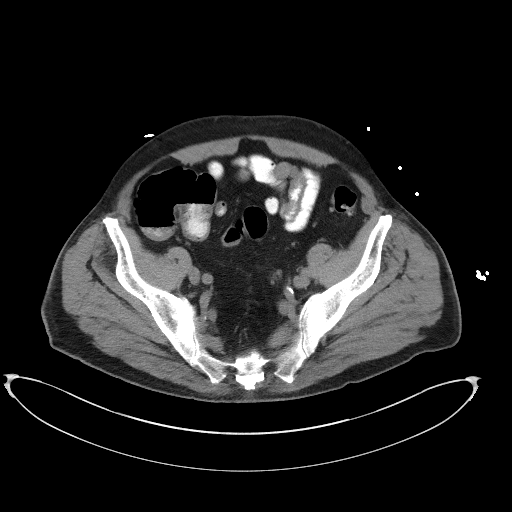
[im 42/107  soft-tissue]
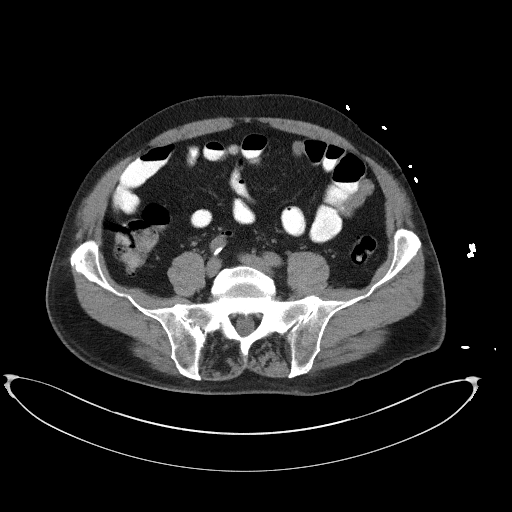
[im 51/107  soft-tissue]
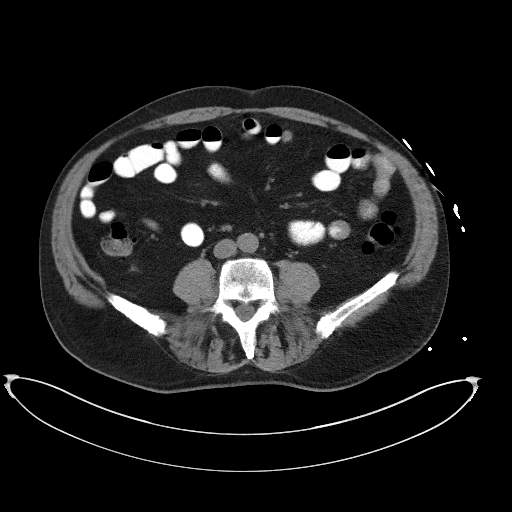
[im 56/107  soft-tissue]
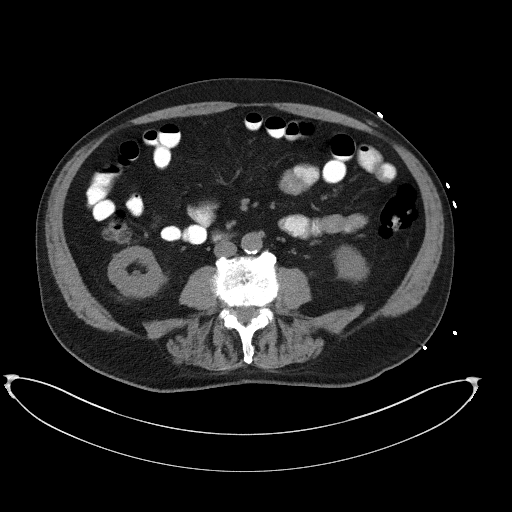
[im 65/107  soft-tissue]
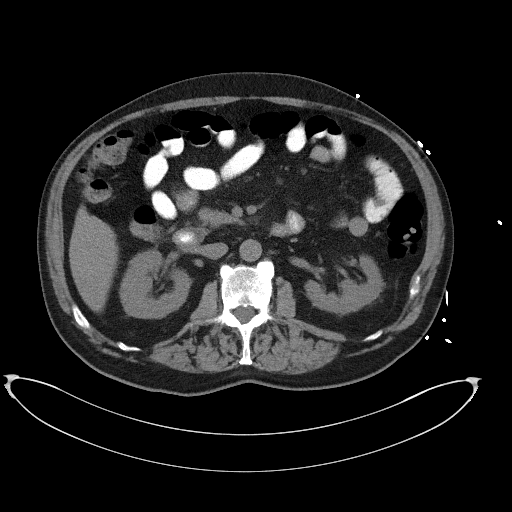
[im 74/107  soft-tissue]
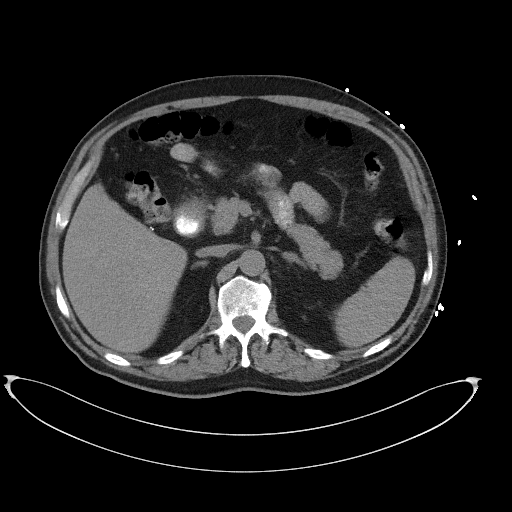
[im 74/107  bone]
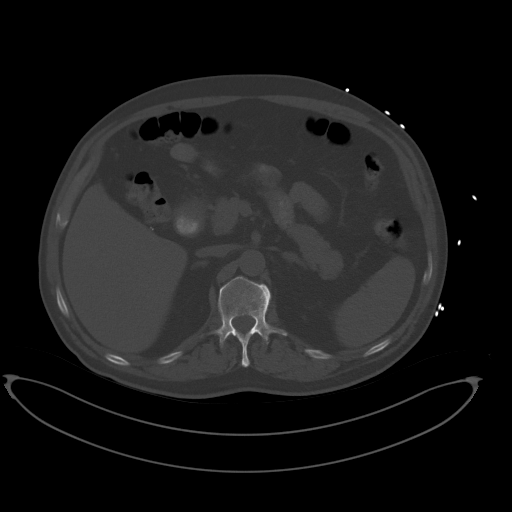
[im 83/107  soft-tissue]
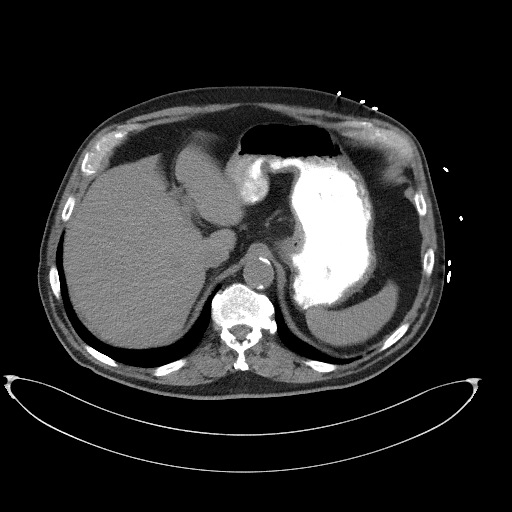
[im 93/107  soft-tissue]
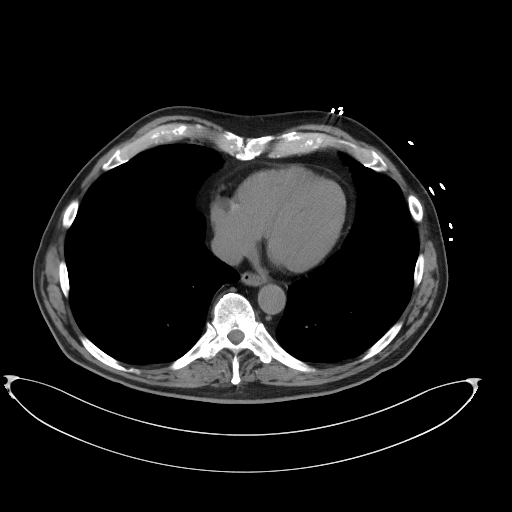
[im 102/107  soft-tissue]
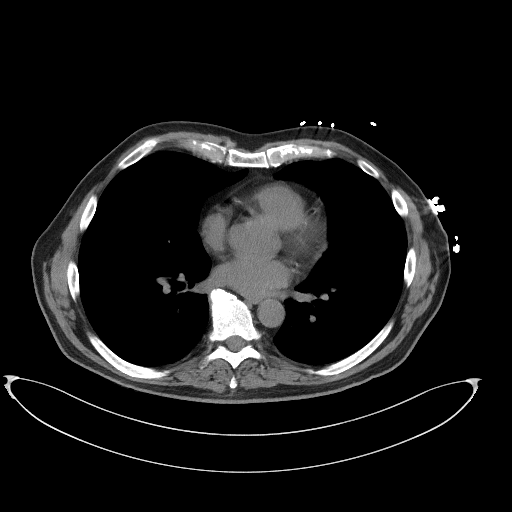

[Series 4: coronal st · coronal · 0.86mm/px · 3 of 107 slices shown]
[im 36/107  soft-tissue]
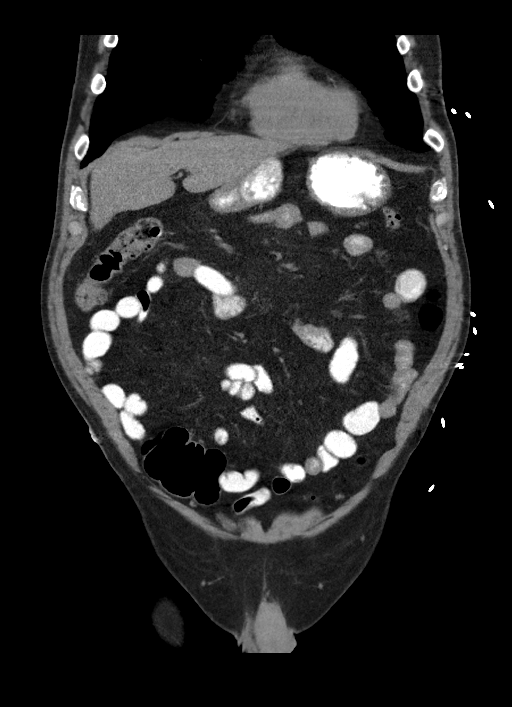
[im 48/107  soft-tissue]
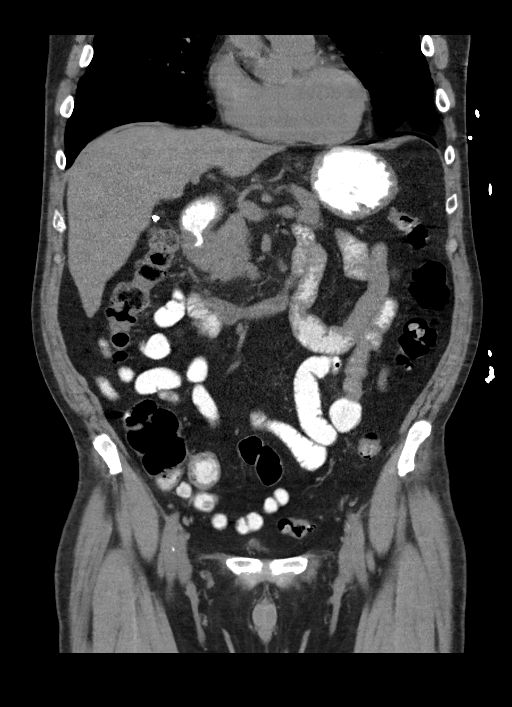
[im 59/107  soft-tissue]
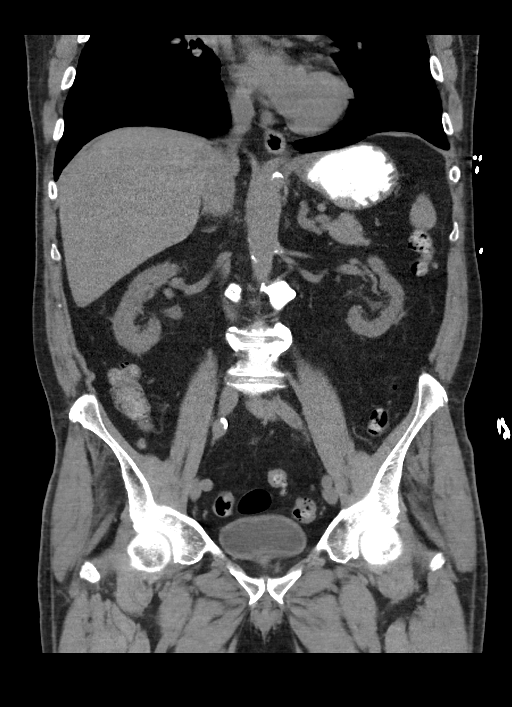

[15 of 46 positions shown; findings below may reference images not displayed]

FINDINGS: Lower chest: The visualized lung bases are grossly clear. Scattered
coronary artery calcifications are seen.

Hepatobiliary: The liver is unremarkable in appearance. The patient
is status post cholecystectomy, with clips noted at the gallbladder
fossa. The common bile duct remains normal in caliber.

Pancreas: The pancreas is within normal limits.

Spleen: The spleen is unremarkable in appearance.

Adrenals/Urinary Tract: The adrenal glands are unremarkable in
appearance. The kidneys are within normal limits. There is no
evidence of hydronephrosis. No renal or ureteral stones are
identified. Nonspecific perinephric stranding is noted bilaterally.

Stomach/Bowel: The stomach is unremarkable in appearance.

There appears to be mild wall thickening along the proximal
duodenum.

The appendix is normal in caliber, without evidence of appendicitis.
The colon is unremarkable in appearance.

Mild diverticulosis is noted along the ascending, descending and
sigmoid colon, without evidence of diverticulitis.

Vascular/Lymphatic: Scattered calcification is seen along the
abdominal aorta and its branches. The abdominal aorta is otherwise
grossly unremarkable. The inferior vena cava is grossly
unremarkable. No retroperitoneal lymphadenopathy is seen. No pelvic
sidewall lymphadenopathy is identified.

Reproductive: The bladder is mildly distended. The prostate is
enlarged, measuring 5.6 cm transverse dimension, with impression on
the base of the bladder.

Other: No additional soft tissue abnormalities are seen.

Musculoskeletal: No acute osseous abnormalities are identified. Mild
disc space narrowing is noted along the mid to lower lumbar spine.
The visualized musculature is unremarkable in appearance.
IMPRESSION: 1. Scattered diverticulosis along the ascending, descending and
sigmoid colon, without evidence of diverticulitis.
2. Apparent mild wall thickening along the proximal duodenum.
Underlying mass cannot be entirely excluded, given the patient's
melena. Endoscopy would be helpful for further evaluation, when and
as deemed clinically appropriate.
3. Scattered coronary artery calcifications seen.
4. Scattered aortic atherosclerosis.
5. Enlarged prostate, with impression on the base of the bladder.
Would correlate with PSA.

## 2018-02-12 DIAGNOSIS — E1165 Type 2 diabetes mellitus with hyperglycemia: Secondary | ICD-10-CM | POA: Diagnosis not present

## 2018-02-12 DIAGNOSIS — Z9189 Other specified personal risk factors, not elsewhere classified: Secondary | ICD-10-CM | POA: Diagnosis not present

## 2018-02-12 DIAGNOSIS — K210 Gastro-esophageal reflux disease with esophagitis: Secondary | ICD-10-CM | POA: Diagnosis not present

## 2018-02-12 DIAGNOSIS — E782 Mixed hyperlipidemia: Secondary | ICD-10-CM | POA: Diagnosis not present

## 2018-02-17 DIAGNOSIS — E782 Mixed hyperlipidemia: Secondary | ICD-10-CM | POA: Diagnosis not present

## 2018-02-17 DIAGNOSIS — E1165 Type 2 diabetes mellitus with hyperglycemia: Secondary | ICD-10-CM | POA: Diagnosis not present

## 2018-02-17 DIAGNOSIS — G301 Alzheimer's disease with late onset: Secondary | ICD-10-CM | POA: Diagnosis not present

## 2018-02-17 DIAGNOSIS — E039 Hypothyroidism, unspecified: Secondary | ICD-10-CM | POA: Diagnosis not present

## 2018-03-13 DIAGNOSIS — M25521 Pain in right elbow: Secondary | ICD-10-CM | POA: Diagnosis not present

## 2018-03-13 DIAGNOSIS — R262 Difficulty in walking, not elsewhere classified: Secondary | ICD-10-CM | POA: Diagnosis not present

## 2018-03-13 DIAGNOSIS — M25511 Pain in right shoulder: Secondary | ICD-10-CM | POA: Diagnosis not present

## 2018-03-13 DIAGNOSIS — M79622 Pain in left upper arm: Secondary | ICD-10-CM | POA: Diagnosis not present

## 2018-05-05 DIAGNOSIS — Z6824 Body mass index (BMI) 24.0-24.9, adult: Secondary | ICD-10-CM | POA: Diagnosis not present

## 2018-05-05 DIAGNOSIS — R21 Rash and other nonspecific skin eruption: Secondary | ICD-10-CM | POA: Diagnosis not present

## 2018-05-05 DIAGNOSIS — G301 Alzheimer's disease with late onset: Secondary | ICD-10-CM | POA: Diagnosis not present

## 2018-05-05 DIAGNOSIS — L03114 Cellulitis of left upper limb: Secondary | ICD-10-CM | POA: Diagnosis not present

## 2018-05-13 DIAGNOSIS — G301 Alzheimer's disease with late onset: Secondary | ICD-10-CM | POA: Diagnosis not present

## 2018-05-13 DIAGNOSIS — Z6824 Body mass index (BMI) 24.0-24.9, adult: Secondary | ICD-10-CM | POA: Diagnosis not present

## 2018-05-13 DIAGNOSIS — R21 Rash and other nonspecific skin eruption: Secondary | ICD-10-CM | POA: Diagnosis not present

## 2018-06-03 DIAGNOSIS — H35352 Cystoid macular degeneration, left eye: Secondary | ICD-10-CM | POA: Diagnosis not present

## 2018-06-03 DIAGNOSIS — H4089 Other specified glaucoma: Secondary | ICD-10-CM | POA: Diagnosis not present

## 2018-06-25 DIAGNOSIS — Z9189 Other specified personal risk factors, not elsewhere classified: Secondary | ICD-10-CM | POA: Diagnosis not present

## 2018-06-25 DIAGNOSIS — K210 Gastro-esophageal reflux disease with esophagitis: Secondary | ICD-10-CM | POA: Diagnosis not present

## 2018-06-25 DIAGNOSIS — E1165 Type 2 diabetes mellitus with hyperglycemia: Secondary | ICD-10-CM | POA: Diagnosis not present

## 2018-06-25 DIAGNOSIS — E039 Hypothyroidism, unspecified: Secondary | ICD-10-CM | POA: Diagnosis not present

## 2018-06-25 DIAGNOSIS — E782 Mixed hyperlipidemia: Secondary | ICD-10-CM | POA: Diagnosis not present

## 2018-06-30 DIAGNOSIS — Z0001 Encounter for general adult medical examination with abnormal findings: Secondary | ICD-10-CM | POA: Diagnosis not present

## 2018-06-30 DIAGNOSIS — I10 Essential (primary) hypertension: Secondary | ICD-10-CM | POA: Diagnosis not present

## 2018-06-30 DIAGNOSIS — Z6824 Body mass index (BMI) 24.0-24.9, adult: Secondary | ICD-10-CM | POA: Diagnosis not present

## 2018-06-30 DIAGNOSIS — Z23 Encounter for immunization: Secondary | ICD-10-CM | POA: Diagnosis not present

## 2018-07-02 DIAGNOSIS — H4089 Other specified glaucoma: Secondary | ICD-10-CM | POA: Diagnosis not present

## 2018-07-18 ENCOUNTER — Encounter (HOSPITAL_COMMUNITY): Payer: Self-pay | Admitting: Emergency Medicine

## 2018-07-18 ENCOUNTER — Emergency Department (HOSPITAL_COMMUNITY)
Admission: EM | Admit: 2018-07-18 | Discharge: 2018-07-18 | Disposition: A | Discharge status: Home / Self Care | Payer: Medicare Other | Attending: Emergency Medicine | Admitting: Emergency Medicine

## 2018-07-18 DIAGNOSIS — Z5321 Procedure and treatment not carried out due to patient leaving prior to being seen by health care provider: Secondary | ICD-10-CM | POA: Diagnosis not present

## 2018-07-18 DIAGNOSIS — L299 Pruritus, unspecified: Secondary | ICD-10-CM | POA: Diagnosis not present

## 2018-07-18 NOTE — ED Triage Notes (Signed)
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  Daughter feels the areas are increasingly becoming red and irritated and needs antibiotic.

## 2018-07-20 DIAGNOSIS — H35352 Cystoid macular degeneration, left eye: Secondary | ICD-10-CM | POA: Diagnosis not present

## 2018-07-20 DIAGNOSIS — L03119 Cellulitis of unspecified part of limb: Secondary | ICD-10-CM | POA: Diagnosis not present

## 2018-07-20 DIAGNOSIS — L02519 Cutaneous abscess of unspecified hand: Secondary | ICD-10-CM | POA: Diagnosis not present

## 2018-08-08 DIAGNOSIS — H109 Unspecified conjunctivitis: Secondary | ICD-10-CM | POA: Diagnosis not present

## 2018-08-10 DIAGNOSIS — H109 Unspecified conjunctivitis: Secondary | ICD-10-CM | POA: Diagnosis not present

## 2018-08-10 DIAGNOSIS — S0500XA Injury of conjunctiva and corneal abrasion without foreign body, unspecified eye, initial encounter: Secondary | ICD-10-CM | POA: Diagnosis not present

## 2018-08-11 DIAGNOSIS — H16141 Punctate keratitis, right eye: Secondary | ICD-10-CM | POA: Diagnosis not present

## 2018-09-24 DIAGNOSIS — H35372 Puckering of macula, left eye: Secondary | ICD-10-CM | POA: Diagnosis not present

## 2018-09-27 DIAGNOSIS — R6889 Other general symptoms and signs: Secondary | ICD-10-CM | POA: Diagnosis not present

## 2018-09-27 DIAGNOSIS — J069 Acute upper respiratory infection, unspecified: Secondary | ICD-10-CM | POA: Diagnosis not present

## 2018-10-22 DIAGNOSIS — E782 Mixed hyperlipidemia: Secondary | ICD-10-CM | POA: Diagnosis not present

## 2018-10-22 DIAGNOSIS — G301 Alzheimer's disease with late onset: Secondary | ICD-10-CM | POA: Diagnosis not present

## 2018-10-22 DIAGNOSIS — E1165 Type 2 diabetes mellitus with hyperglycemia: Secondary | ICD-10-CM | POA: Diagnosis not present

## 2018-10-22 DIAGNOSIS — I10 Essential (primary) hypertension: Secondary | ICD-10-CM | POA: Diagnosis not present

## 2018-10-22 DIAGNOSIS — K210 Gastro-esophageal reflux disease with esophagitis: Secondary | ICD-10-CM | POA: Diagnosis not present

## 2018-10-26 DIAGNOSIS — I10 Essential (primary) hypertension: Secondary | ICD-10-CM | POA: Diagnosis not present

## 2018-10-26 DIAGNOSIS — E039 Hypothyroidism, unspecified: Secondary | ICD-10-CM | POA: Diagnosis not present

## 2018-10-26 DIAGNOSIS — K8681 Exocrine pancreatic insufficiency: Secondary | ICD-10-CM | POA: Diagnosis not present

## 2018-10-26 DIAGNOSIS — E1165 Type 2 diabetes mellitus with hyperglycemia: Secondary | ICD-10-CM | POA: Diagnosis not present

## 2018-10-26 DIAGNOSIS — G301 Alzheimer's disease with late onset: Secondary | ICD-10-CM | POA: Diagnosis not present

## 2018-12-01 DIAGNOSIS — G301 Alzheimer's disease with late onset: Secondary | ICD-10-CM | POA: Diagnosis not present

## 2018-12-08 DIAGNOSIS — S0081XA Abrasion of other part of head, initial encounter: Secondary | ICD-10-CM | POA: Diagnosis not present

## 2018-12-08 DIAGNOSIS — S59902A Unspecified injury of left elbow, initial encounter: Secondary | ICD-10-CM | POA: Diagnosis not present

## 2018-12-08 DIAGNOSIS — M542 Cervicalgia: Secondary | ICD-10-CM | POA: Diagnosis not present

## 2018-12-08 DIAGNOSIS — I10 Essential (primary) hypertension: Secondary | ICD-10-CM | POA: Diagnosis not present

## 2018-12-08 DIAGNOSIS — Z79899 Other long term (current) drug therapy: Secondary | ICD-10-CM | POA: Diagnosis not present

## 2018-12-08 DIAGNOSIS — G629 Polyneuropathy, unspecified: Secondary | ICD-10-CM | POA: Diagnosis not present

## 2018-12-08 DIAGNOSIS — S0990XA Unspecified injury of head, initial encounter: Secondary | ICD-10-CM | POA: Diagnosis not present

## 2018-12-08 DIAGNOSIS — M1990 Unspecified osteoarthritis, unspecified site: Secondary | ICD-10-CM | POA: Diagnosis not present

## 2018-12-08 DIAGNOSIS — W1839XA Other fall on same level, initial encounter: Secondary | ICD-10-CM | POA: Diagnosis not present

## 2018-12-08 DIAGNOSIS — S0181XA Laceration without foreign body of other part of head, initial encounter: Secondary | ICD-10-CM | POA: Diagnosis not present

## 2019-02-18 DIAGNOSIS — E1165 Type 2 diabetes mellitus with hyperglycemia: Secondary | ICD-10-CM | POA: Diagnosis not present

## 2019-02-18 DIAGNOSIS — I10 Essential (primary) hypertension: Secondary | ICD-10-CM | POA: Diagnosis not present

## 2019-02-18 DIAGNOSIS — K210 Gastro-esophageal reflux disease with esophagitis: Secondary | ICD-10-CM | POA: Diagnosis not present

## 2019-02-18 DIAGNOSIS — E782 Mixed hyperlipidemia: Secondary | ICD-10-CM | POA: Diagnosis not present

## 2019-02-18 DIAGNOSIS — R946 Abnormal results of thyroid function studies: Secondary | ICD-10-CM | POA: Diagnosis not present

## 2019-02-20 DIAGNOSIS — I10 Essential (primary) hypertension: Secondary | ICD-10-CM | POA: Diagnosis not present

## 2019-02-20 DIAGNOSIS — E782 Mixed hyperlipidemia: Secondary | ICD-10-CM | POA: Diagnosis not present

## 2019-02-23 DIAGNOSIS — G301 Alzheimer's disease with late onset: Secondary | ICD-10-CM | POA: Diagnosis not present

## 2019-02-23 DIAGNOSIS — E782 Mixed hyperlipidemia: Secondary | ICD-10-CM | POA: Diagnosis not present

## 2019-02-23 DIAGNOSIS — K8681 Exocrine pancreatic insufficiency: Secondary | ICD-10-CM | POA: Diagnosis not present

## 2019-02-23 DIAGNOSIS — E1165 Type 2 diabetes mellitus with hyperglycemia: Secondary | ICD-10-CM | POA: Diagnosis not present

## 2019-02-23 DIAGNOSIS — I10 Essential (primary) hypertension: Secondary | ICD-10-CM | POA: Diagnosis not present

## 2019-03-23 DIAGNOSIS — E1165 Type 2 diabetes mellitus with hyperglycemia: Secondary | ICD-10-CM | POA: Diagnosis not present

## 2019-03-23 DIAGNOSIS — E039 Hypothyroidism, unspecified: Secondary | ICD-10-CM | POA: Diagnosis not present

## 2019-03-23 DIAGNOSIS — E782 Mixed hyperlipidemia: Secondary | ICD-10-CM | POA: Diagnosis not present

## 2019-04-07 DIAGNOSIS — E039 Hypothyroidism, unspecified: Secondary | ICD-10-CM | POA: Diagnosis not present

## 2019-04-23 DIAGNOSIS — I10 Essential (primary) hypertension: Secondary | ICD-10-CM | POA: Diagnosis not present

## 2019-04-23 DIAGNOSIS — E785 Hyperlipidemia, unspecified: Secondary | ICD-10-CM | POA: Diagnosis not present

## 2019-05-24 DIAGNOSIS — E039 Hypothyroidism, unspecified: Secondary | ICD-10-CM | POA: Diagnosis not present

## 2019-05-24 DIAGNOSIS — I10 Essential (primary) hypertension: Secondary | ICD-10-CM | POA: Diagnosis not present

## 2019-05-24 DIAGNOSIS — E782 Mixed hyperlipidemia: Secondary | ICD-10-CM | POA: Diagnosis not present

## 2019-05-24 DIAGNOSIS — K219 Gastro-esophageal reflux disease without esophagitis: Secondary | ICD-10-CM | POA: Diagnosis not present

## 2019-05-24 DIAGNOSIS — E1165 Type 2 diabetes mellitus with hyperglycemia: Secondary | ICD-10-CM | POA: Diagnosis not present

## 2019-05-26 DIAGNOSIS — K8681 Exocrine pancreatic insufficiency: Secondary | ICD-10-CM | POA: Diagnosis not present

## 2019-05-26 DIAGNOSIS — E1165 Type 2 diabetes mellitus with hyperglycemia: Secondary | ICD-10-CM | POA: Diagnosis not present

## 2019-05-26 DIAGNOSIS — G301 Alzheimer's disease with late onset: Secondary | ICD-10-CM | POA: Diagnosis not present

## 2019-05-26 DIAGNOSIS — Z23 Encounter for immunization: Secondary | ICD-10-CM | POA: Diagnosis not present

## 2019-05-26 DIAGNOSIS — I10 Essential (primary) hypertension: Secondary | ICD-10-CM | POA: Diagnosis not present

## 2019-06-11 ENCOUNTER — Other Ambulatory Visit: Payer: Self-pay

## 2019-06-11 NOTE — Patient Outreach (Signed)
[facility] [facility]) Care Management  06/11/2019  SAN MANSBERGER 04-Oct-1940 [id number]   Medication Adherence call to Mr. Zhyon Modeste  Patient did not answer voice mail not set up. Mr. Jeffres is showing past due on Atorvastatin 10 mg under [place].   [place] Management Direct Dial [phone]  Fax [phone] [email or web address]

## 2019-06-23 DIAGNOSIS — E785 Hyperlipidemia, unspecified: Secondary | ICD-10-CM | POA: Diagnosis not present

## 2019-06-23 DIAGNOSIS — I10 Essential (primary) hypertension: Secondary | ICD-10-CM | POA: Diagnosis not present

## 2019-06-29 ENCOUNTER — Other Ambulatory Visit: Payer: Self-pay

## 2019-06-29 NOTE — Patient Outreach (Signed)
[name]  06/29/2019  Kenneth Santiago [birth year] [id number]   Medication Adherence call to [name] daughter answer said patient is not available and hung up. [name] is showing past due on Atorvastatin 10 mg under [place].   [place] Management Direct Dial [phone]  Fax [phone] [email or web address]

## 2019-07-23 DIAGNOSIS — E039 Hypothyroidism, unspecified: Secondary | ICD-10-CM | POA: Diagnosis not present

## 2019-07-23 DIAGNOSIS — E782 Mixed hyperlipidemia: Secondary | ICD-10-CM | POA: Diagnosis not present

## 2019-08-26 DIAGNOSIS — I10 Essential (primary) hypertension: Secondary | ICD-10-CM | POA: Diagnosis not present

## 2019-08-26 DIAGNOSIS — E039 Hypothyroidism, unspecified: Secondary | ICD-10-CM | POA: Diagnosis not present

## 2019-08-26 DIAGNOSIS — K900 Celiac disease: Secondary | ICD-10-CM | POA: Diagnosis not present

## 2019-08-26 DIAGNOSIS — E1165 Type 2 diabetes mellitus with hyperglycemia: Secondary | ICD-10-CM | POA: Diagnosis not present

## 2019-08-26 DIAGNOSIS — E876 Hypokalemia: Secondary | ICD-10-CM | POA: Diagnosis not present

## 2019-08-26 DIAGNOSIS — G301 Alzheimer's disease with late onset: Secondary | ICD-10-CM | POA: Diagnosis not present

## 2019-08-30 DIAGNOSIS — Z0001 Encounter for general adult medical examination with abnormal findings: Secondary | ICD-10-CM | POA: Diagnosis not present

## 2019-08-30 DIAGNOSIS — I10 Essential (primary) hypertension: Secondary | ICD-10-CM | POA: Diagnosis not present

## 2019-08-30 DIAGNOSIS — K8681 Exocrine pancreatic insufficiency: Secondary | ICD-10-CM | POA: Diagnosis not present

## 2019-08-30 DIAGNOSIS — E1165 Type 2 diabetes mellitus with hyperglycemia: Secondary | ICD-10-CM | POA: Diagnosis not present

## 2019-09-13 DIAGNOSIS — E1140 Type 2 diabetes mellitus with diabetic neuropathy, unspecified: Secondary | ICD-10-CM | POA: Diagnosis not present

## 2019-09-13 DIAGNOSIS — M79672 Pain in left foot: Secondary | ICD-10-CM | POA: Diagnosis not present

## 2019-09-13 DIAGNOSIS — L110 Acquired keratosis follicularis: Secondary | ICD-10-CM | POA: Diagnosis not present

## 2019-09-13 DIAGNOSIS — M79671 Pain in right foot: Secondary | ICD-10-CM | POA: Diagnosis not present

## 2019-09-13 DIAGNOSIS — I739 Peripheral vascular disease, unspecified: Secondary | ICD-10-CM | POA: Diagnosis not present

## 2019-10-22 DIAGNOSIS — I10 Essential (primary) hypertension: Secondary | ICD-10-CM | POA: Diagnosis not present

## 2019-10-22 DIAGNOSIS — G301 Alzheimer's disease with late onset: Secondary | ICD-10-CM | POA: Diagnosis not present

## 2019-12-22 DIAGNOSIS — E7849 Other hyperlipidemia: Secondary | ICD-10-CM | POA: Diagnosis not present

## 2019-12-31 DIAGNOSIS — E039 Hypothyroidism, unspecified: Secondary | ICD-10-CM | POA: Diagnosis not present

## 2019-12-31 DIAGNOSIS — K2100 Gastro-esophageal reflux disease with esophagitis, without bleeding: Secondary | ICD-10-CM | POA: Diagnosis not present

## 2019-12-31 DIAGNOSIS — E782 Mixed hyperlipidemia: Secondary | ICD-10-CM | POA: Diagnosis not present

## 2019-12-31 DIAGNOSIS — I10 Essential (primary) hypertension: Secondary | ICD-10-CM | POA: Diagnosis not present

## 2019-12-31 DIAGNOSIS — E1165 Type 2 diabetes mellitus with hyperglycemia: Secondary | ICD-10-CM | POA: Diagnosis not present

## 2020-01-19 DIAGNOSIS — E1165 Type 2 diabetes mellitus with hyperglycemia: Secondary | ICD-10-CM | POA: Diagnosis not present

## 2020-01-19 DIAGNOSIS — I10 Essential (primary) hypertension: Secondary | ICD-10-CM | POA: Diagnosis not present

## 2020-01-19 DIAGNOSIS — K8681 Exocrine pancreatic insufficiency: Secondary | ICD-10-CM | POA: Diagnosis not present

## 2020-01-21 DIAGNOSIS — I10 Essential (primary) hypertension: Secondary | ICD-10-CM | POA: Diagnosis not present

## 2020-01-21 DIAGNOSIS — E039 Hypothyroidism, unspecified: Secondary | ICD-10-CM | POA: Diagnosis not present

## 2020-01-23 DIAGNOSIS — G301 Alzheimer's disease with late onset: Secondary | ICD-10-CM | POA: Diagnosis not present

## 2020-01-29 DIAGNOSIS — K219 Gastro-esophageal reflux disease without esophagitis: Secondary | ICD-10-CM | POA: Diagnosis not present

## 2020-01-29 DIAGNOSIS — R609 Edema, unspecified: Secondary | ICD-10-CM | POA: Diagnosis not present

## 2020-01-29 DIAGNOSIS — E1165 Type 2 diabetes mellitus with hyperglycemia: Secondary | ICD-10-CM | POA: Diagnosis not present

## 2020-01-29 DIAGNOSIS — Z7984 Long term (current) use of oral hypoglycemic drugs: Secondary | ICD-10-CM | POA: Diagnosis not present

## 2020-01-29 DIAGNOSIS — K8681 Exocrine pancreatic insufficiency: Secondary | ICD-10-CM | POA: Diagnosis not present

## 2020-01-29 DIAGNOSIS — Z9181 History of falling: Secondary | ICD-10-CM | POA: Diagnosis not present

## 2020-01-29 DIAGNOSIS — G301 Alzheimer's disease with late onset: Secondary | ICD-10-CM | POA: Diagnosis not present

## 2020-01-29 DIAGNOSIS — M791 Myalgia, unspecified site: Secondary | ICD-10-CM | POA: Diagnosis not present

## 2020-01-29 DIAGNOSIS — I10 Essential (primary) hypertension: Secondary | ICD-10-CM | POA: Diagnosis not present

## 2020-01-29 DIAGNOSIS — E782 Mixed hyperlipidemia: Secondary | ICD-10-CM | POA: Diagnosis not present

## 2020-01-29 DIAGNOSIS — E039 Hypothyroidism, unspecified: Secondary | ICD-10-CM | POA: Diagnosis not present

## 2020-02-01 ENCOUNTER — Other Ambulatory Visit: Payer: Self-pay | Admitting: *Deleted

## 2020-02-01 NOTE — Patient Outreach (Signed)
[facility]) Care Management  02/01/2020  NTHONY TRIPLETT May 11, 1941 [id number]   [facility] Telephone Assessment/Screen for MD referral  Referral Date: 01/20/20 Referral Source: Dr Gar Ponto office staff Amy Referral Reason: "Alzheimer" Insurance:united healthcare medicare     Outreach attempt # 1 successful to the daughter Olean Ree She is able to verify HIPAA, DOB and address Reviewed and addressed referral to [facility] with her [facility] RN CM spoke with Mr Keylen Duffany Evett's daughter Olean Ree as Mr Cantey has alzheimer's dementia  Larena Glassman confirms [facility] have started since the [facility] referral date of 01/20/20 [facility] RN CM discussed that [facility] is not a home health agency  [facility] RN CM discussed the difference in [facility] and [facility]   home health [facility])  Occupational therapy (OT) was scheduled to arrive today per Larena Glassman  Mr Grahmann is noted to have had [facility] SW services in 2019  During 2019 [place] provided information on Aging, Disability and Transient Services agency in [place], [place], [place] with [employer]   Referral assessment/screening with [facility] RN discussion or interventions Mr Garten has been followed by Dr Quillian Quince for over 25 years.  No neurologist He is on Risperdal and Seroquel [facility] RN CM discussed the use of the united healthcare over the counter (OTC) benefit to get incontinence and other supplies. Larena Glassman was provided the contact number for [facility] as [phone]. She was encouraged to have Mr Zinter insurance card during the call to [place] and to also check to see if catalogs may have been received already to the home address.  THN RN CM discussed Moms Meals program via [place] healthcare coverage Larena Glassman discussed that Mr Barefield was not able to previously qualify for meals on wheels and she is aware that there is a meals on wheels waiting list in [place] discussed the possible use of local fire  department/EMS for assistance with Mr Polmanteer if there is a fall and the sitter or other family is not able to assist with mobilizing him up Larena Glassman prefers for all correspondences/letters to be sent to her address of [address] [place] [address]. THN RN CM entered this in Epic as Mr Levenhagen temporary address as preferred by Merrily Pew RN CM discussed visiting home MD as a possible option if is tasking with getting MR Estepa out of home to MD    Social: Mr NORVELL RALSTON is a 79 year old retired widow male who lives with his daughter Wayne Sever (teacher) and her family (husband and children) not too far from his home.  Larena Glassman works 10 minutes away from the home. Marybeth's husband is at the home generally as his work schedule assists with this. He also has a brother and sister that live close by. He has 24 our supervision from the family members and/or from the hired Catering manager (out of pocket) who visits 7-8 hours prn. This sitter is reported to be "petite: in stature related to "getting him up off the floor" He is assisted with all care needs and transportation by the family or sitter.   The family and sitter assists with ALL  Meals, Activities of daily living (ADLs) and  IADLs (instrumental Activities of Daily Living) Olean Ree reports progression of memory concerns  Mr Martha is reported to be legally blind  Mr Riedinger is reported to be able to do simple tasks only but not multiple tasks consistently as he  gets confused  Mr Guyot is reported to be incontinent of bowel an bladder with use of incontinences supplies  Larena Glassman reports for ambulation Mr Suhy rarely uses his walker and generally holds on to the wall etc    Conditions: Alzheimer's dementia, Essential Hypertension (HTN), chronic diarrhea, diverticulosis of colon without hemorrhage, upper gastrointestinal bleed, celiac disease, incontinence of bowel and bladder,  right inguinal hernia, diabetes, glaucoma,  macular degeneration, legally blind, never smoker   Falls 4-5 this year   Weight per daughter is 199 lbs Height per daughter is 5'7"   DME: cbg monitor, wrist BP cuff, walker (hardly uses), shower bench, 3 N 1,glasses (several years old - new ones needed- Dr Darcella Cheshire [place] )  Medications: mary beth reports all medications are manageable with cost  She reports the highest cost is $32 denies concerns with taking medications as prescribed, affording medications, side effects of medications and questions about medications   Consent: [facility]N RN CM reviewed [facility] services with patient. Patient gave verbal consent for services [facility] telephonic RN CM.   Plan: [facility] RN CM will follow up with Mr Yale and his daughter within the next 7-14 business days   Pt's daughter encouraged to return a call to [facility] RN CM prn  [facility] RN CM sent a [employer] with [employer] brochure, [employer], [facility] consent form with return envelope and know before you go sheet enclosed for review  Routed note to MDs/NP/PA  [facility] CM Care Plan Problem One     Most Recent Value  Care Plan Problem One  resources to assist with home/respite care -dementia  Role Documenting the Problem One  Care Management Telephonic Coordinator  Care Plan for Problem One  Active  THN Long Term Goal   over the next 90 days patient/family will be able to verbalize knowledge and resources to assist with home/respite care during outreach  [facility] Long Term Goal Start Date  02/01/20  Interventions for Problem One Long Term Goal  initial assessment, discussed UHC OTC program, Discussed moms meals, discussed eye care,Discussed home health care services/use of local fire department, discussed neurology  [facility] CM Short Term Goal #1   over the next 30 days patient will receive scheduled home health services as ordered as confirmed during outreach  [facility] CM Short Term Goal #1 Start Date  02/01/20  Interventions for Short Term Goal #1  initial assessment, discussed  UHC OTC program, Discussed moms meals, discussed eye care,Discussed home health care services/use of local fire department, discussed neurology      [place]. Lavina Hamman, RN, BSN, [place] Coordinator Office number [phone] Mobile number [phone]  Main THN number [phone] Fax number [phone]

## 2020-02-02 DIAGNOSIS — M791 Myalgia, unspecified site: Secondary | ICD-10-CM | POA: Diagnosis not present

## 2020-02-02 DIAGNOSIS — G301 Alzheimer's disease with late onset: Secondary | ICD-10-CM | POA: Diagnosis not present

## 2020-02-02 DIAGNOSIS — E039 Hypothyroidism, unspecified: Secondary | ICD-10-CM | POA: Diagnosis not present

## 2020-02-02 DIAGNOSIS — E782 Mixed hyperlipidemia: Secondary | ICD-10-CM | POA: Diagnosis not present

## 2020-02-02 DIAGNOSIS — Z9181 History of falling: Secondary | ICD-10-CM | POA: Diagnosis not present

## 2020-02-02 DIAGNOSIS — I10 Essential (primary) hypertension: Secondary | ICD-10-CM | POA: Diagnosis not present

## 2020-02-02 DIAGNOSIS — K219 Gastro-esophageal reflux disease without esophagitis: Secondary | ICD-10-CM | POA: Diagnosis not present

## 2020-02-02 DIAGNOSIS — E1165 Type 2 diabetes mellitus with hyperglycemia: Secondary | ICD-10-CM | POA: Diagnosis not present

## 2020-02-02 DIAGNOSIS — K8681 Exocrine pancreatic insufficiency: Secondary | ICD-10-CM | POA: Diagnosis not present

## 2020-02-02 DIAGNOSIS — Z7984 Long term (current) use of oral hypoglycemic drugs: Secondary | ICD-10-CM | POA: Diagnosis not present

## 2020-02-02 DIAGNOSIS — R609 Edema, unspecified: Secondary | ICD-10-CM | POA: Diagnosis not present

## 2020-02-07 ENCOUNTER — Other Ambulatory Visit: Payer: Self-pay | Admitting: *Deleted

## 2020-02-07 ENCOUNTER — Encounter: Payer: Self-pay | Admitting: *Deleted

## 2020-02-07 NOTE — Patient Outreach (Signed)
[facility]) Care Management  02/07/2020  SABAS SABET 08-21-1941 [id number]   Unsuccessful THN outreach to MD referred patient  Referral Date: 01/20/20 Referral Source: Dr Gar Ponto office staff Amy Referral Reason: "Alzheimer"   Insurance:united healthcare medicare     Outreach attempt # 2 unsuccessful to the daughter Olean Ree  No answer. THN RN CM left HIPAA [facility] Portability and Accountability Act) compliant voicemail message along with CM's contact info.    Social: Mr HUNTLEY NARAIN is a 79 year old retired widow male who lives with his daughter Wayne Sever (teacher) and her family (husband and children) not too far from his home.  Larena Glassman works 10 minutes away from the home. Marybeth's husband is at the home generally as his work schedule assists with this. He also has a brother and sister that live close by. He has 24 our supervision from the family members and/or from the hired Catering manager (out of pocket) who visits 7-8 hours prn. This sitter is reported to be "petite: in stature related to "getting him up off the floor" He is assisted with all care needs and transportation by the family or sitter.   The family and sitter assists with ALL  Meals, Activities of daily living (ADLs) and  IADLs (instrumental Activities of Daily Living) Olean Ree reports progression of memory concerns  Mr Shankel is reported to be legally blind  Mr Brus is reported to be able to do simple tasks only but not multiple tasks consistently as he gets confused  Mr Braden is reported to be incontinent of bowel an bladder with use of incontinences supplies  Larena Glassman reports for ambulation Mr Backes rarely uses his walker and generally holds on to the wall etc    Conditions: Alzheimer's dementia, Essential Hypertension (HTN), chronic diarrhea, diverticulosis of colon without hemorrhage, upper gastrointestinal bleed, celiac disease, incontinence of  bowel and bladder,  right inguinal hernia, diabetes, glaucoma, macular degeneration, legally blind, never smoker   Falls 4-5 this year   Weight per daughter is 199 lbs Height per daughter is 5'7"   DME: cbg monitor, wrist BP cuff, walker (hardly uses), shower bench, 3 N 1,glasses (several years old - new ones needed- Dr Darcella Cheshire [place] )    Plan: [facility] RN CM will follow up with Mr Greiman and his daughter within the next 7-14 business days   [place] L. Lavina Hamman, RN, BSN, [place] Coordinator Office number [phone] Mobile number [phone]  Main THN number [phone] Fax number [phone]

## 2020-02-07 NOTE — Patient Outreach (Addendum)
[facility]) Care Management  02/07/2020  Kenneth Santiago [birth year] [id number]   Return call from Daughter for MD referred patient  Referral Date:01/20/20 Referral Source:Kenneth Gar [place] office staff Amy Referral Reason:"Alzheimer"   Insurance:united healthcare medicare   Kenneth Santiago Daughter returned a call to [place]  She is able to verify HIPAA ([place] and Accountability Act) identifiers, date of birth (DOB) and address Discussed follow up on resources for OTC, ophthmologists, and home health   Reviewed the home health schedule discussed with Kenneth Santiago at [facility] [phone] as [facility] PT to visit on 02/08/20, [facility] RN to visit on Wednesday and [facility] OT to visit on on Thursday [place] they are to call a day before the scheduled arrival  Kenneth Santiago has not had the opportunity to call [employer] at this time   Mrs Kenneth Santiago provided an e-mail address to send items quicker if possible  This e-mail address was added to demographics  [facility] RN CM sent resources via mail for a list of in network ophthalmologists ([facility] Kenneth and Vladimir Faster), [place] personal care services agencies  In home care and respite agencies and ompanion care services [facility] RN Cm sent resources via e mail on dementia: caregiver and hypertension (high Blood Pressure)     Social: Kenneth Santiago is a 79 year old retired widow male who lives with his daughter Kenneth Santiago (teacher)and her family (husband and children) not too far from his home. Kenneth Santiago works 10 minutes away from the home. Kenneth Santiago's husband is at the home generally as his work schedule assists with this.He also has a brother and sister that live close by. He has 24 our supervision from the family members and/or from the hired Catering manager (out of pocket) who visits 7-8 hours prn. This sitter is reported to be "petite: in stature related to "getting him up off the floor"He is assistedwith all care  needs and transportation by the family or sitter.  The family and sitter assists with ALL Meals, Activities of daily living (ADLs)andIADLs (instrumental Activities of Daily Living) Kenneth Santiago reports progression of memory concerns Kenneth Santiago is reported to be legally blind  Kenneth Santiago is reported to be able to do simple tasks only but not multiple tasks consistently as he gets confused  Kenneth Santiago is reported to be incontinent of bowel an bladder with use of incontinences supplies  Kenneth Santiago reports for ambulation Kenneth Stavely rarely uses his walker and generally holds on to the wall etc   Conditions:Alzheimer's dementia, EssentialHypertension (HTN), chronic diarrhea, diverticulosis of colon without hemorrhage, upper gastrointestinal bleed, celiac disease, incontinence of bowel and bladder, right inguinalhernia, diabetes, glaucoma, macular degeneration, legally blind, never smoker   Falls4-5 this year   Weight per daughter is199 lbs Height per daughter is 5'7"  DME:cbg monitor, wristBPcuff, walker (hardly uses), shower bench, 3 N 1,glasses (several years old - new ones needed- Kenneth Santiago )    Plan: [facility] RN CM willfollow up with Kenneth Santiago and his daughter within the next 7-14 business days  Pt encouraged to return a call to [facility] RN CM prn Routed note to MD  [facility] CM Care Plan Problem One     Most Recent Value  Care Plan Problem One  resources to assist with home/respite care -dementia  Role Documenting the Problem One  Care Management Telephonic Coordinator  Care Plan for Problem One  Active  [facility] Long Term Goal  over the next 90 days patient/family will be able to verbalize knowledge and resources to assist with home/respite care during outreach  [place] Term Goal Start Date  02/01/20  Interventions for Problem One Long Term Goal  sent a list of in network eyw providers, sent list of respite care, sent a information on companion care& PCS list  sent EMMIs on HTN and Dementia assessed outreach to [facility]  [facility] CM Short Term Goal #1   over the next 30 days patient will receive scheduled home health services as ordered as confirmed during outreach  [facility] CM Short Term Goal #1 Start Date  02/01/20  Interventions for Short Term Goal #1  assessed home health outreach Reviewed this [facility] schedule with her       Kenneth Millin L. Lavina Hamman, RN, BSN, [place] Coordinator Office number [phone] Mobile number [phone]  Main THN number [phone] Fax number [phone]

## 2020-02-08 DIAGNOSIS — E1165 Type 2 diabetes mellitus with hyperglycemia: Secondary | ICD-10-CM | POA: Diagnosis not present

## 2020-02-08 DIAGNOSIS — Z7984 Long term (current) use of oral hypoglycemic drugs: Secondary | ICD-10-CM | POA: Diagnosis not present

## 2020-02-08 DIAGNOSIS — E039 Hypothyroidism, unspecified: Secondary | ICD-10-CM | POA: Diagnosis not present

## 2020-02-08 DIAGNOSIS — R609 Edema, unspecified: Secondary | ICD-10-CM | POA: Diagnosis not present

## 2020-02-08 DIAGNOSIS — K8681 Exocrine pancreatic insufficiency: Secondary | ICD-10-CM | POA: Diagnosis not present

## 2020-02-08 DIAGNOSIS — K219 Gastro-esophageal reflux disease without esophagitis: Secondary | ICD-10-CM | POA: Diagnosis not present

## 2020-02-08 DIAGNOSIS — M791 Myalgia, unspecified site: Secondary | ICD-10-CM | POA: Diagnosis not present

## 2020-02-08 DIAGNOSIS — G301 Alzheimer's disease with late onset: Secondary | ICD-10-CM | POA: Diagnosis not present

## 2020-02-08 DIAGNOSIS — I10 Essential (primary) hypertension: Secondary | ICD-10-CM | POA: Diagnosis not present

## 2020-02-08 DIAGNOSIS — Z9181 History of falling: Secondary | ICD-10-CM | POA: Diagnosis not present

## 2020-02-08 DIAGNOSIS — E782 Mixed hyperlipidemia: Secondary | ICD-10-CM | POA: Diagnosis not present

## 2020-02-09 DIAGNOSIS — I5030 Unspecified diastolic (congestive) heart failure: Secondary | ICD-10-CM | POA: Diagnosis not present

## 2020-02-14 ENCOUNTER — Ambulatory Visit: Payer: Self-pay | Admitting: *Deleted

## 2020-02-15 DIAGNOSIS — E782 Mixed hyperlipidemia: Secondary | ICD-10-CM | POA: Diagnosis not present

## 2020-02-15 DIAGNOSIS — R609 Edema, unspecified: Secondary | ICD-10-CM | POA: Diagnosis not present

## 2020-02-15 DIAGNOSIS — E039 Hypothyroidism, unspecified: Secondary | ICD-10-CM | POA: Diagnosis not present

## 2020-02-15 DIAGNOSIS — M791 Myalgia, unspecified site: Secondary | ICD-10-CM | POA: Diagnosis not present

## 2020-02-15 DIAGNOSIS — K219 Gastro-esophageal reflux disease without esophagitis: Secondary | ICD-10-CM | POA: Diagnosis not present

## 2020-02-15 DIAGNOSIS — E1165 Type 2 diabetes mellitus with hyperglycemia: Secondary | ICD-10-CM | POA: Diagnosis not present

## 2020-02-15 DIAGNOSIS — K8681 Exocrine pancreatic insufficiency: Secondary | ICD-10-CM | POA: Diagnosis not present

## 2020-02-15 DIAGNOSIS — I10 Essential (primary) hypertension: Secondary | ICD-10-CM | POA: Diagnosis not present

## 2020-02-15 DIAGNOSIS — G301 Alzheimer's disease with late onset: Secondary | ICD-10-CM | POA: Diagnosis not present

## 2020-02-15 DIAGNOSIS — Z9181 History of falling: Secondary | ICD-10-CM | POA: Diagnosis not present

## 2020-02-15 DIAGNOSIS — Z7984 Long term (current) use of oral hypoglycemic drugs: Secondary | ICD-10-CM | POA: Diagnosis not present

## 2020-02-16 ENCOUNTER — Other Ambulatory Visit: Payer: Self-pay

## 2020-02-16 ENCOUNTER — Other Ambulatory Visit: Payer: Self-pay | Admitting: *Deleted

## 2020-02-16 DIAGNOSIS — E039 Hypothyroidism, unspecified: Secondary | ICD-10-CM | POA: Diagnosis not present

## 2020-02-16 DIAGNOSIS — I10 Essential (primary) hypertension: Secondary | ICD-10-CM | POA: Diagnosis not present

## 2020-02-16 DIAGNOSIS — E1165 Type 2 diabetes mellitus with hyperglycemia: Secondary | ICD-10-CM | POA: Diagnosis not present

## 2020-02-16 DIAGNOSIS — G301 Alzheimer's disease with late onset: Secondary | ICD-10-CM | POA: Diagnosis not present

## 2020-02-16 DIAGNOSIS — K8681 Exocrine pancreatic insufficiency: Secondary | ICD-10-CM | POA: Diagnosis not present

## 2020-02-16 DIAGNOSIS — K219 Gastro-esophageal reflux disease without esophagitis: Secondary | ICD-10-CM | POA: Diagnosis not present

## 2020-02-16 DIAGNOSIS — Z9181 History of falling: Secondary | ICD-10-CM | POA: Diagnosis not present

## 2020-02-16 DIAGNOSIS — E782 Mixed hyperlipidemia: Secondary | ICD-10-CM | POA: Diagnosis not present

## 2020-02-16 DIAGNOSIS — M791 Myalgia, unspecified site: Secondary | ICD-10-CM | POA: Diagnosis not present

## 2020-02-16 DIAGNOSIS — R609 Edema, unspecified: Secondary | ICD-10-CM | POA: Diagnosis not present

## 2020-02-16 DIAGNOSIS — Z7984 Long term (current) use of oral hypoglycemic drugs: Secondary | ICD-10-CM | POA: Diagnosis not present

## 2020-02-16 NOTE — Patient Outreach (Signed)
[place] [facility]) Care Management  02/16/2020  Kenneth Santiago 08-03-1941 [id number]  [facility] outreach to MD referred patient Referral Date:4/29/21Referral Source:Dr Gar [facility] office staff Amy Referral Reason:"Alzheimer"   Insurance:united healthcare medicare  Outreach attempt unsuccessful No answer. [facility]N RN CM left HIPAA [facility] Portability and Accountability Act) compliant voicemail message along with CM's contact info.    Patient's daughter Kenneth Santiago returned a call to [facility] RN CM She is able to verify HIPAA, DOB and Address Reviewed the outreach to [employer] with her that indicated [name] did not have the over the counter united health care benefit  Discussed how to check with [facility] on a plan the may during the upcoming medicare active enrollment period  Plan: [facility] RN CM scheduled this patient for another call attempt within 14-21 business days  Rizwan Kuyper L. Lavina Hamman, RN, BSN, [place] Coordinator Office number [phone] Mobile number [phone]  Main THN number [phone] Fax number [phone]

## 2020-02-22 DIAGNOSIS — K219 Gastro-esophageal reflux disease without esophagitis: Secondary | ICD-10-CM | POA: Diagnosis not present

## 2020-02-22 DIAGNOSIS — Z7984 Long term (current) use of oral hypoglycemic drugs: Secondary | ICD-10-CM | POA: Diagnosis not present

## 2020-02-22 DIAGNOSIS — E782 Mixed hyperlipidemia: Secondary | ICD-10-CM | POA: Diagnosis not present

## 2020-02-22 DIAGNOSIS — Z9181 History of falling: Secondary | ICD-10-CM | POA: Diagnosis not present

## 2020-02-22 DIAGNOSIS — M791 Myalgia, unspecified site: Secondary | ICD-10-CM | POA: Diagnosis not present

## 2020-02-22 DIAGNOSIS — E039 Hypothyroidism, unspecified: Secondary | ICD-10-CM | POA: Diagnosis not present

## 2020-02-22 DIAGNOSIS — E1165 Type 2 diabetes mellitus with hyperglycemia: Secondary | ICD-10-CM | POA: Diagnosis not present

## 2020-02-22 DIAGNOSIS — R609 Edema, unspecified: Secondary | ICD-10-CM | POA: Diagnosis not present

## 2020-02-22 DIAGNOSIS — G301 Alzheimer's disease with late onset: Secondary | ICD-10-CM | POA: Diagnosis not present

## 2020-02-22 DIAGNOSIS — K8681 Exocrine pancreatic insufficiency: Secondary | ICD-10-CM | POA: Diagnosis not present

## 2020-02-22 DIAGNOSIS — I10 Essential (primary) hypertension: Secondary | ICD-10-CM | POA: Diagnosis not present

## 2020-02-23 DIAGNOSIS — G301 Alzheimer's disease with late onset: Secondary | ICD-10-CM | POA: Diagnosis not present

## 2020-02-23 DIAGNOSIS — I10 Essential (primary) hypertension: Secondary | ICD-10-CM | POA: Diagnosis not present

## 2020-02-23 DIAGNOSIS — Z7984 Long term (current) use of oral hypoglycemic drugs: Secondary | ICD-10-CM | POA: Diagnosis not present

## 2020-02-23 DIAGNOSIS — K8681 Exocrine pancreatic insufficiency: Secondary | ICD-10-CM | POA: Diagnosis not present

## 2020-02-23 DIAGNOSIS — E782 Mixed hyperlipidemia: Secondary | ICD-10-CM | POA: Diagnosis not present

## 2020-02-23 DIAGNOSIS — Z9181 History of falling: Secondary | ICD-10-CM | POA: Diagnosis not present

## 2020-02-23 DIAGNOSIS — E039 Hypothyroidism, unspecified: Secondary | ICD-10-CM | POA: Diagnosis not present

## 2020-02-23 DIAGNOSIS — R609 Edema, unspecified: Secondary | ICD-10-CM | POA: Diagnosis not present

## 2020-02-23 DIAGNOSIS — E1165 Type 2 diabetes mellitus with hyperglycemia: Secondary | ICD-10-CM | POA: Diagnosis not present

## 2020-02-23 DIAGNOSIS — M791 Myalgia, unspecified site: Secondary | ICD-10-CM | POA: Diagnosis not present

## 2020-02-23 DIAGNOSIS — K219 Gastro-esophageal reflux disease without esophagitis: Secondary | ICD-10-CM | POA: Diagnosis not present

## 2020-02-29 DIAGNOSIS — R609 Edema, unspecified: Secondary | ICD-10-CM | POA: Diagnosis not present

## 2020-02-29 DIAGNOSIS — K219 Gastro-esophageal reflux disease without esophagitis: Secondary | ICD-10-CM | POA: Diagnosis not present

## 2020-02-29 DIAGNOSIS — G301 Alzheimer's disease with late onset: Secondary | ICD-10-CM | POA: Diagnosis not present

## 2020-02-29 DIAGNOSIS — K8681 Exocrine pancreatic insufficiency: Secondary | ICD-10-CM | POA: Diagnosis not present

## 2020-02-29 DIAGNOSIS — E1165 Type 2 diabetes mellitus with hyperglycemia: Secondary | ICD-10-CM | POA: Diagnosis not present

## 2020-02-29 DIAGNOSIS — E039 Hypothyroidism, unspecified: Secondary | ICD-10-CM | POA: Diagnosis not present

## 2020-02-29 DIAGNOSIS — Z9181 History of falling: Secondary | ICD-10-CM | POA: Diagnosis not present

## 2020-02-29 DIAGNOSIS — I10 Essential (primary) hypertension: Secondary | ICD-10-CM | POA: Diagnosis not present

## 2020-02-29 DIAGNOSIS — E782 Mixed hyperlipidemia: Secondary | ICD-10-CM | POA: Diagnosis not present

## 2020-02-29 DIAGNOSIS — Z7984 Long term (current) use of oral hypoglycemic drugs: Secondary | ICD-10-CM | POA: Diagnosis not present

## 2020-02-29 DIAGNOSIS — M791 Myalgia, unspecified site: Secondary | ICD-10-CM | POA: Diagnosis not present

## 2020-03-02 DIAGNOSIS — I10 Essential (primary) hypertension: Secondary | ICD-10-CM | POA: Diagnosis not present

## 2020-03-02 DIAGNOSIS — Z7984 Long term (current) use of oral hypoglycemic drugs: Secondary | ICD-10-CM | POA: Diagnosis not present

## 2020-03-02 DIAGNOSIS — E782 Mixed hyperlipidemia: Secondary | ICD-10-CM | POA: Diagnosis not present

## 2020-03-02 DIAGNOSIS — G301 Alzheimer's disease with late onset: Secondary | ICD-10-CM | POA: Diagnosis not present

## 2020-03-02 DIAGNOSIS — Z9181 History of falling: Secondary | ICD-10-CM | POA: Diagnosis not present

## 2020-03-02 DIAGNOSIS — R609 Edema, unspecified: Secondary | ICD-10-CM | POA: Diagnosis not present

## 2020-03-02 DIAGNOSIS — M791 Myalgia, unspecified site: Secondary | ICD-10-CM | POA: Diagnosis not present

## 2020-03-02 DIAGNOSIS — K219 Gastro-esophageal reflux disease without esophagitis: Secondary | ICD-10-CM | POA: Diagnosis not present

## 2020-03-02 DIAGNOSIS — K8681 Exocrine pancreatic insufficiency: Secondary | ICD-10-CM | POA: Diagnosis not present

## 2020-03-02 DIAGNOSIS — E1165 Type 2 diabetes mellitus with hyperglycemia: Secondary | ICD-10-CM | POA: Diagnosis not present

## 2020-03-02 DIAGNOSIS — E039 Hypothyroidism, unspecified: Secondary | ICD-10-CM | POA: Diagnosis not present

## 2020-03-03 ENCOUNTER — Other Ambulatory Visit: Payer: Self-pay | Admitting: *Deleted

## 2020-03-03 NOTE — Patient Outreach (Signed)
[place] [facility]) Care Management  03/03/2020  Kenneth Santiago October 17, 1940 [id number]   [facility] outreach to MD referred patient Referral Date:4/29/21Referral Source:Dr Gar P[place] office staff Amy Referral Reason:"Alzheimer"   Insurance:united healthcare medicare  Outreach attempt unsuccessful to [phone] No answer. [facility] RN CM left HIPAA [facility] Portability and Accountability Act) compliant voicemail message along with CMs contact info.   Plan: [facility] RN CM scheduled this patient for another call attempt within 7-10 business days  Jacora Hopkins L. Lavina Hamman, RN, BSN, [place] Coordinator Office number [phone] Mobile number [phone]  Main THN number [phone] Fax number [phone]

## 2020-03-06 DIAGNOSIS — M25539 Pain in unspecified wrist: Secondary | ICD-10-CM | POA: Diagnosis not present

## 2020-03-06 DIAGNOSIS — E039 Hypothyroidism, unspecified: Secondary | ICD-10-CM | POA: Diagnosis not present

## 2020-03-06 DIAGNOSIS — I10 Essential (primary) hypertension: Secondary | ICD-10-CM | POA: Diagnosis not present

## 2020-03-06 DIAGNOSIS — G301 Alzheimer's disease with late onset: Secondary | ICD-10-CM | POA: Diagnosis not present

## 2020-03-06 DIAGNOSIS — E1165 Type 2 diabetes mellitus with hyperglycemia: Secondary | ICD-10-CM | POA: Diagnosis not present

## 2020-03-06 DIAGNOSIS — K8681 Exocrine pancreatic insufficiency: Secondary | ICD-10-CM | POA: Diagnosis not present

## 2020-03-06 DIAGNOSIS — R404 Transient alteration of awareness: Secondary | ICD-10-CM | POA: Diagnosis not present

## 2020-03-06 DIAGNOSIS — M791 Myalgia, unspecified site: Secondary | ICD-10-CM | POA: Diagnosis not present

## 2020-03-06 DIAGNOSIS — M25532 Pain in left wrist: Secondary | ICD-10-CM | POA: Diagnosis not present

## 2020-03-06 DIAGNOSIS — S59902A Unspecified injury of left elbow, initial encounter: Secondary | ICD-10-CM | POA: Diagnosis not present

## 2020-03-06 DIAGNOSIS — S6992XA Unspecified injury of left wrist, hand and finger(s), initial encounter: Secondary | ICD-10-CM | POA: Diagnosis not present

## 2020-03-06 DIAGNOSIS — K219 Gastro-esophageal reflux disease without esophagitis: Secondary | ICD-10-CM | POA: Diagnosis not present

## 2020-03-06 DIAGNOSIS — M47812 Spondylosis without myelopathy or radiculopathy, cervical region: Secondary | ICD-10-CM | POA: Diagnosis not present

## 2020-03-06 DIAGNOSIS — R079 Chest pain, unspecified: Secondary | ICD-10-CM | POA: Diagnosis not present

## 2020-03-06 DIAGNOSIS — R609 Edema, unspecified: Secondary | ICD-10-CM | POA: Diagnosis not present

## 2020-03-06 DIAGNOSIS — R52 Pain, unspecified: Secondary | ICD-10-CM | POA: Diagnosis not present

## 2020-03-06 DIAGNOSIS — S59912A Unspecified injury of left forearm, initial encounter: Secondary | ICD-10-CM | POA: Diagnosis not present

## 2020-03-06 DIAGNOSIS — Z743 Need for continuous supervision: Secondary | ICD-10-CM | POA: Diagnosis not present

## 2020-03-06 DIAGNOSIS — S0990XA Unspecified injury of head, initial encounter: Secondary | ICD-10-CM | POA: Diagnosis not present

## 2020-03-06 DIAGNOSIS — M79642 Pain in left hand: Secondary | ICD-10-CM | POA: Diagnosis not present

## 2020-03-06 DIAGNOSIS — S199XXA Unspecified injury of neck, initial encounter: Secondary | ICD-10-CM | POA: Diagnosis not present

## 2020-03-06 DIAGNOSIS — M79602 Pain in left arm: Secondary | ICD-10-CM | POA: Diagnosis not present

## 2020-03-06 DIAGNOSIS — Z9181 History of falling: Secondary | ICD-10-CM | POA: Diagnosis not present

## 2020-03-06 DIAGNOSIS — R410 Disorientation, unspecified: Secondary | ICD-10-CM | POA: Diagnosis not present

## 2020-03-06 DIAGNOSIS — E782 Mixed hyperlipidemia: Secondary | ICD-10-CM | POA: Diagnosis not present

## 2020-03-06 DIAGNOSIS — Z7984 Long term (current) use of oral hypoglycemic drugs: Secondary | ICD-10-CM | POA: Diagnosis not present

## 2020-03-09 DIAGNOSIS — E1165 Type 2 diabetes mellitus with hyperglycemia: Secondary | ICD-10-CM | POA: Diagnosis not present

## 2020-03-09 DIAGNOSIS — K219 Gastro-esophageal reflux disease without esophagitis: Secondary | ICD-10-CM | POA: Diagnosis not present

## 2020-03-09 DIAGNOSIS — Z7984 Long term (current) use of oral hypoglycemic drugs: Secondary | ICD-10-CM | POA: Diagnosis not present

## 2020-03-09 DIAGNOSIS — M791 Myalgia, unspecified site: Secondary | ICD-10-CM | POA: Diagnosis not present

## 2020-03-09 DIAGNOSIS — R609 Edema, unspecified: Secondary | ICD-10-CM | POA: Diagnosis not present

## 2020-03-09 DIAGNOSIS — E039 Hypothyroidism, unspecified: Secondary | ICD-10-CM | POA: Diagnosis not present

## 2020-03-09 DIAGNOSIS — Z9181 History of falling: Secondary | ICD-10-CM | POA: Diagnosis not present

## 2020-03-09 DIAGNOSIS — M25532 Pain in left wrist: Secondary | ICD-10-CM | POA: Diagnosis not present

## 2020-03-09 DIAGNOSIS — K8681 Exocrine pancreatic insufficiency: Secondary | ICD-10-CM | POA: Diagnosis not present

## 2020-03-09 DIAGNOSIS — E782 Mixed hyperlipidemia: Secondary | ICD-10-CM | POA: Diagnosis not present

## 2020-03-09 DIAGNOSIS — I10 Essential (primary) hypertension: Secondary | ICD-10-CM | POA: Diagnosis not present

## 2020-03-09 DIAGNOSIS — G301 Alzheimer's disease with late onset: Secondary | ICD-10-CM | POA: Diagnosis not present

## 2020-03-10 ENCOUNTER — Ambulatory Visit: Payer: Self-pay | Admitting: *Deleted

## 2020-03-10 DIAGNOSIS — E039 Hypothyroidism, unspecified: Secondary | ICD-10-CM | POA: Diagnosis not present

## 2020-03-10 DIAGNOSIS — K219 Gastro-esophageal reflux disease without esophagitis: Secondary | ICD-10-CM | POA: Diagnosis not present

## 2020-03-10 DIAGNOSIS — Z9181 History of falling: Secondary | ICD-10-CM | POA: Diagnosis not present

## 2020-03-10 DIAGNOSIS — I10 Essential (primary) hypertension: Secondary | ICD-10-CM | POA: Diagnosis not present

## 2020-03-10 DIAGNOSIS — E782 Mixed hyperlipidemia: Secondary | ICD-10-CM | POA: Diagnosis not present

## 2020-03-10 DIAGNOSIS — M791 Myalgia, unspecified site: Secondary | ICD-10-CM | POA: Diagnosis not present

## 2020-03-10 DIAGNOSIS — G301 Alzheimer's disease with late onset: Secondary | ICD-10-CM | POA: Diagnosis not present

## 2020-03-10 DIAGNOSIS — R609 Edema, unspecified: Secondary | ICD-10-CM | POA: Diagnosis not present

## 2020-03-10 DIAGNOSIS — E1165 Type 2 diabetes mellitus with hyperglycemia: Secondary | ICD-10-CM | POA: Diagnosis not present

## 2020-03-10 DIAGNOSIS — Z7984 Long term (current) use of oral hypoglycemic drugs: Secondary | ICD-10-CM | POA: Diagnosis not present

## 2020-03-10 DIAGNOSIS — K8681 Exocrine pancreatic insufficiency: Secondary | ICD-10-CM | POA: Diagnosis not present

## 2020-03-14 DIAGNOSIS — E1165 Type 2 diabetes mellitus with hyperglycemia: Secondary | ICD-10-CM | POA: Diagnosis not present

## 2020-03-14 DIAGNOSIS — E039 Hypothyroidism, unspecified: Secondary | ICD-10-CM | POA: Diagnosis not present

## 2020-03-14 DIAGNOSIS — E782 Mixed hyperlipidemia: Secondary | ICD-10-CM | POA: Diagnosis not present

## 2020-03-14 DIAGNOSIS — K219 Gastro-esophageal reflux disease without esophagitis: Secondary | ICD-10-CM | POA: Diagnosis not present

## 2020-03-14 DIAGNOSIS — R609 Edema, unspecified: Secondary | ICD-10-CM | POA: Diagnosis not present

## 2020-03-14 DIAGNOSIS — I10 Essential (primary) hypertension: Secondary | ICD-10-CM | POA: Diagnosis not present

## 2020-03-14 DIAGNOSIS — G301 Alzheimer's disease with late onset: Secondary | ICD-10-CM | POA: Diagnosis not present

## 2020-03-14 DIAGNOSIS — Z7984 Long term (current) use of oral hypoglycemic drugs: Secondary | ICD-10-CM | POA: Diagnosis not present

## 2020-03-14 DIAGNOSIS — K8681 Exocrine pancreatic insufficiency: Secondary | ICD-10-CM | POA: Diagnosis not present

## 2020-03-14 DIAGNOSIS — M791 Myalgia, unspecified site: Secondary | ICD-10-CM | POA: Diagnosis not present

## 2020-03-14 DIAGNOSIS — Z9181 History of falling: Secondary | ICD-10-CM | POA: Diagnosis not present

## 2020-03-15 ENCOUNTER — Other Ambulatory Visit: Payer: Self-pay | Admitting: *Deleted

## 2020-03-16 ENCOUNTER — Other Ambulatory Visit: Payer: Self-pay | Admitting: *Deleted

## 2020-03-16 ENCOUNTER — Other Ambulatory Visit: Payer: Self-pay

## 2020-03-16 NOTE — Patient Outreach (Signed)
[place] [facility]) Care Management  03/16/2020  Kenneth Santiago [birth year] [id number]  [facility] outreach to Telephone Assessment/Screen for MD referred patient  Referral Date: 01/20/20 Referral Source: Dr Gar Ponto office staff Amy Referral Reason: "Alzheimer" Insurance:united healthcare medicare     Outreach successful to the daughter Kenneth Santiago She is able to verify HIPAA, DOB and address Reviewed and addressed referral to [facility] her [facility] RN CM spoke with Kenneth Kenneth Santiago's daughter Kenneth Santiago as Kenneth Santiago has alzheimer's dementia   Kenneth Santiago is noted to have had [facility] SW services in 2019  During 2019 [facility] provided information on Aging, Disability and Transient Services agency in [place], [place], [place] with [employer]  Follow up assessment  New sitter  Larena Glassman reports she has not worked this week and has stayed with Kenneth Santiago as there was a change in his sitter. The new sitter started today 03/16/20  [facility] continue but will soon be ending as pt has reach his home health goals. Marybeth she hs not noted much improvement in his mobility but her goal for "maintenance" as Kenneth Santiago was noted to be "unsteady"  She states the [facility] PT initial goal was to have Kenneth Santiago to walk with hs walker  Larena Glassman reports Kenneth Santiago mobility varies She reports on some days he walks without any device and get in and out of a car without any issues She reports on some days he is not able to do anything.   Hospital bed [facility] RN visited this week and Dr Quillian Quince was outreached to offer a hospital bed Presently Kenneth Santiago is in a twin bed and has fallen out at intervals without major injury Larena Glassman does not feel she will be able to find rails for this bed  Pending delivery of hospital bed   Recent ED visit Kenneth Santiago confirms Kenneth Santiago was recently evaluated in the ED on 03/06/20 after he exhibited wrist pain with a [address] PT  staff The tests and imaging at the ED showed only arthritis  Larena Glassman reports this has happened before Kenneth Santiago will be reporting and exhibiting intense pain but after an examine the pain resolves Larena Glassman reports attempting to inform [facility] staff of these episodes without success  Had ED follow up with pcp on March 10 2020 He was given a steroid shot He is without pain today    Plans [facility] RN CM will follow up with Kenneth Santiago within the next 14-21 business days  Routed note to MD Pt encouraged to return a call to [facility] RN CM prn  [facility] CM Care Plan Problem One     Most Recent Value  Care Plan Problem One resources to assist with home/respite care -dementia  Role Documenting the Problem One Care Management Telephonic Coordinator  Care Plan for Problem One Active  [facility] Long Term Goal  over the next 90 days patient/family will be able to verbalize knowledge and resources to assist with home/respite care during outreach  [facility] Long Term Goal Start Date 02/01/20  Interventions for Problem One Long Term Goal assessed for home managment an progress/status of home health/resources  THN CM Short Term Goal #1  over the next 30 days patient will receive scheduled home health services as ordered as confirmed during outreach  [facility] CM Short Term Goal #1 Start Date 02/01/20  [facility] CM Short Term Goal #1 Met Date 03/16/20  THN CM Short Term Goal #2  over  the next 30 day pt/family wil report received DME-hospital bed iprn during outreach  calls  [facility] CM Short Term Goal #2 Start Date 03/16/20  Interventions for Short Term Goal #2 assessed for home managment an progress/status of home health/resources Inquired about order for bed, discussed process to get DME       Joelene Millin L. Lavina Hamman, RN, BSN, [place] Coordinator Office number [phone] Mobile number [phone]  Main THN number [phone] Fax number [phone]

## 2020-03-17 ENCOUNTER — Other Ambulatory Visit: Payer: Self-pay | Admitting: *Deleted

## 2020-03-17 NOTE — Patient Outreach (Signed)
[place] [facility]) Care Management  03/17/2020  MILBURN FREENEY [birth year] [id number]   [place] coordination- pcp office DME (bed)  [facility]  RN CM called primary care provider (PCP) office  Spoke with Tiffany to follow up on possible order request for hospital bed as discussed by patient daughter on 03/16/20 Discussed Daughter, Larena Glassman concern with patient falling out of present twin bed without rails. Tiffany took a message to give to MD/RN to include [facility] RN CM office number    [facility] RN CM will follow up with Mr Bachus within the next 14-21 business days  Routed note to MD  Johnson. Lavina Hamman, RN, BSN, [place] Coordinator Office number [phone] Mobile number [phone]  Main THN number [phone] Fax number [phone]

## 2020-03-21 DIAGNOSIS — R609 Edema, unspecified: Secondary | ICD-10-CM | POA: Diagnosis not present

## 2020-03-21 DIAGNOSIS — K8681 Exocrine pancreatic insufficiency: Secondary | ICD-10-CM | POA: Diagnosis not present

## 2020-03-21 DIAGNOSIS — K219 Gastro-esophageal reflux disease without esophagitis: Secondary | ICD-10-CM | POA: Diagnosis not present

## 2020-03-21 DIAGNOSIS — G301 Alzheimer's disease with late onset: Secondary | ICD-10-CM | POA: Diagnosis not present

## 2020-03-21 DIAGNOSIS — E1165 Type 2 diabetes mellitus with hyperglycemia: Secondary | ICD-10-CM | POA: Diagnosis not present

## 2020-03-21 DIAGNOSIS — M791 Myalgia, unspecified site: Secondary | ICD-10-CM | POA: Diagnosis not present

## 2020-03-21 DIAGNOSIS — Z9181 History of falling: Secondary | ICD-10-CM | POA: Diagnosis not present

## 2020-03-21 DIAGNOSIS — E039 Hypothyroidism, unspecified: Secondary | ICD-10-CM | POA: Diagnosis not present

## 2020-03-21 DIAGNOSIS — I10 Essential (primary) hypertension: Secondary | ICD-10-CM | POA: Diagnosis not present

## 2020-03-21 DIAGNOSIS — Z7984 Long term (current) use of oral hypoglycemic drugs: Secondary | ICD-10-CM | POA: Diagnosis not present

## 2020-03-21 DIAGNOSIS — E782 Mixed hyperlipidemia: Secondary | ICD-10-CM | POA: Diagnosis not present

## 2020-03-24 DIAGNOSIS — M791 Myalgia, unspecified site: Secondary | ICD-10-CM | POA: Diagnosis not present

## 2020-03-24 DIAGNOSIS — I10 Essential (primary) hypertension: Secondary | ICD-10-CM | POA: Diagnosis not present

## 2020-03-24 DIAGNOSIS — G301 Alzheimer's disease with late onset: Secondary | ICD-10-CM | POA: Diagnosis not present

## 2020-03-24 DIAGNOSIS — E039 Hypothyroidism, unspecified: Secondary | ICD-10-CM | POA: Diagnosis not present

## 2020-03-24 DIAGNOSIS — Z9181 History of falling: Secondary | ICD-10-CM | POA: Diagnosis not present

## 2020-03-24 DIAGNOSIS — K219 Gastro-esophageal reflux disease without esophagitis: Secondary | ICD-10-CM | POA: Diagnosis not present

## 2020-03-24 DIAGNOSIS — K8681 Exocrine pancreatic insufficiency: Secondary | ICD-10-CM | POA: Diagnosis not present

## 2020-03-24 DIAGNOSIS — Z7984 Long term (current) use of oral hypoglycemic drugs: Secondary | ICD-10-CM | POA: Diagnosis not present

## 2020-03-24 DIAGNOSIS — E1165 Type 2 diabetes mellitus with hyperglycemia: Secondary | ICD-10-CM | POA: Diagnosis not present

## 2020-03-24 DIAGNOSIS — R609 Edema, unspecified: Secondary | ICD-10-CM | POA: Diagnosis not present

## 2020-03-24 DIAGNOSIS — E782 Mixed hyperlipidemia: Secondary | ICD-10-CM | POA: Diagnosis not present

## 2020-03-30 ENCOUNTER — Ambulatory Visit: Payer: Self-pay | Admitting: *Deleted

## 2020-03-31 DIAGNOSIS — Z7984 Long term (current) use of oral hypoglycemic drugs: Secondary | ICD-10-CM | POA: Diagnosis not present

## 2020-03-31 DIAGNOSIS — E782 Mixed hyperlipidemia: Secondary | ICD-10-CM | POA: Diagnosis not present

## 2020-03-31 DIAGNOSIS — K8681 Exocrine pancreatic insufficiency: Secondary | ICD-10-CM | POA: Diagnosis not present

## 2020-03-31 DIAGNOSIS — L89212 Pressure ulcer of right hip, stage 2: Secondary | ICD-10-CM | POA: Diagnosis not present

## 2020-03-31 DIAGNOSIS — Z9181 History of falling: Secondary | ICD-10-CM | POA: Diagnosis not present

## 2020-03-31 DIAGNOSIS — K219 Gastro-esophageal reflux disease without esophagitis: Secondary | ICD-10-CM | POA: Diagnosis not present

## 2020-03-31 DIAGNOSIS — E039 Hypothyroidism, unspecified: Secondary | ICD-10-CM | POA: Diagnosis not present

## 2020-03-31 DIAGNOSIS — E1165 Type 2 diabetes mellitus with hyperglycemia: Secondary | ICD-10-CM | POA: Diagnosis not present

## 2020-03-31 DIAGNOSIS — G301 Alzheimer's disease with late onset: Secondary | ICD-10-CM | POA: Diagnosis not present

## 2020-03-31 DIAGNOSIS — I10 Essential (primary) hypertension: Secondary | ICD-10-CM | POA: Diagnosis not present

## 2020-03-31 DIAGNOSIS — M791 Myalgia, unspecified site: Secondary | ICD-10-CM | POA: Diagnosis not present

## 2020-04-04 DIAGNOSIS — G301 Alzheimer's disease with late onset: Secondary | ICD-10-CM | POA: Diagnosis not present

## 2020-04-04 DIAGNOSIS — K8681 Exocrine pancreatic insufficiency: Secondary | ICD-10-CM | POA: Diagnosis not present

## 2020-04-04 DIAGNOSIS — I10 Essential (primary) hypertension: Secondary | ICD-10-CM | POA: Diagnosis not present

## 2020-04-04 DIAGNOSIS — Z7984 Long term (current) use of oral hypoglycemic drugs: Secondary | ICD-10-CM | POA: Diagnosis not present

## 2020-04-04 DIAGNOSIS — E039 Hypothyroidism, unspecified: Secondary | ICD-10-CM | POA: Diagnosis not present

## 2020-04-04 DIAGNOSIS — E1165 Type 2 diabetes mellitus with hyperglycemia: Secondary | ICD-10-CM | POA: Diagnosis not present

## 2020-04-04 DIAGNOSIS — K219 Gastro-esophageal reflux disease without esophagitis: Secondary | ICD-10-CM | POA: Diagnosis not present

## 2020-04-04 DIAGNOSIS — E782 Mixed hyperlipidemia: Secondary | ICD-10-CM | POA: Diagnosis not present

## 2020-04-04 DIAGNOSIS — L89212 Pressure ulcer of right hip, stage 2: Secondary | ICD-10-CM | POA: Diagnosis not present

## 2020-04-04 DIAGNOSIS — Z9181 History of falling: Secondary | ICD-10-CM | POA: Diagnosis not present

## 2020-04-04 DIAGNOSIS — M791 Myalgia, unspecified site: Secondary | ICD-10-CM | POA: Diagnosis not present

## 2020-04-05 NOTE — Patient Outreach (Signed)
[place] [facility]) Care Management  04/05/2020  Kenneth Santiago 10-14-1940 [id number]   Late entry 03/15/20 encounter opened in error  [place] L. Lavina Hamman, RN, BSN, [place] Coordinator Office number [phone] Mobile number [phone]  Main THN number [phone] Fax number [phone]

## 2020-04-07 DIAGNOSIS — E782 Mixed hyperlipidemia: Secondary | ICD-10-CM | POA: Diagnosis not present

## 2020-04-07 DIAGNOSIS — M791 Myalgia, unspecified site: Secondary | ICD-10-CM | POA: Diagnosis not present

## 2020-04-07 DIAGNOSIS — L89212 Pressure ulcer of right hip, stage 2: Secondary | ICD-10-CM | POA: Diagnosis not present

## 2020-04-07 DIAGNOSIS — Z7984 Long term (current) use of oral hypoglycemic drugs: Secondary | ICD-10-CM | POA: Diagnosis not present

## 2020-04-07 DIAGNOSIS — K8681 Exocrine pancreatic insufficiency: Secondary | ICD-10-CM | POA: Diagnosis not present

## 2020-04-07 DIAGNOSIS — E1165 Type 2 diabetes mellitus with hyperglycemia: Secondary | ICD-10-CM | POA: Diagnosis not present

## 2020-04-07 DIAGNOSIS — I10 Essential (primary) hypertension: Secondary | ICD-10-CM | POA: Diagnosis not present

## 2020-04-07 DIAGNOSIS — K219 Gastro-esophageal reflux disease without esophagitis: Secondary | ICD-10-CM | POA: Diagnosis not present

## 2020-04-07 DIAGNOSIS — G301 Alzheimer's disease with late onset: Secondary | ICD-10-CM | POA: Diagnosis not present

## 2020-04-07 DIAGNOSIS — E039 Hypothyroidism, unspecified: Secondary | ICD-10-CM | POA: Diagnosis not present

## 2020-04-07 DIAGNOSIS — Z9181 History of falling: Secondary | ICD-10-CM | POA: Diagnosis not present

## 2020-04-11 DIAGNOSIS — E782 Mixed hyperlipidemia: Secondary | ICD-10-CM | POA: Diagnosis not present

## 2020-04-11 DIAGNOSIS — E1165 Type 2 diabetes mellitus with hyperglycemia: Secondary | ICD-10-CM | POA: Diagnosis not present

## 2020-04-11 DIAGNOSIS — I10 Essential (primary) hypertension: Secondary | ICD-10-CM | POA: Diagnosis not present

## 2020-04-11 DIAGNOSIS — Z9181 History of falling: Secondary | ICD-10-CM | POA: Diagnosis not present

## 2020-04-11 DIAGNOSIS — Z7984 Long term (current) use of oral hypoglycemic drugs: Secondary | ICD-10-CM | POA: Diagnosis not present

## 2020-04-11 DIAGNOSIS — M791 Myalgia, unspecified site: Secondary | ICD-10-CM | POA: Diagnosis not present

## 2020-04-11 DIAGNOSIS — G301 Alzheimer's disease with late onset: Secondary | ICD-10-CM | POA: Diagnosis not present

## 2020-04-11 DIAGNOSIS — E039 Hypothyroidism, unspecified: Secondary | ICD-10-CM | POA: Diagnosis not present

## 2020-04-11 DIAGNOSIS — K219 Gastro-esophageal reflux disease without esophagitis: Secondary | ICD-10-CM | POA: Diagnosis not present

## 2020-04-11 DIAGNOSIS — K8681 Exocrine pancreatic insufficiency: Secondary | ICD-10-CM | POA: Diagnosis not present

## 2020-04-11 DIAGNOSIS — L89212 Pressure ulcer of right hip, stage 2: Secondary | ICD-10-CM | POA: Diagnosis not present

## 2020-04-13 ENCOUNTER — Ambulatory Visit: Payer: Self-pay | Admitting: *Deleted

## 2020-04-14 DIAGNOSIS — M791 Myalgia, unspecified site: Secondary | ICD-10-CM | POA: Diagnosis not present

## 2020-04-14 DIAGNOSIS — L89212 Pressure ulcer of right hip, stage 2: Secondary | ICD-10-CM | POA: Diagnosis not present

## 2020-04-14 DIAGNOSIS — Z7984 Long term (current) use of oral hypoglycemic drugs: Secondary | ICD-10-CM | POA: Diagnosis not present

## 2020-04-14 DIAGNOSIS — I10 Essential (primary) hypertension: Secondary | ICD-10-CM | POA: Diagnosis not present

## 2020-04-14 DIAGNOSIS — G301 Alzheimer's disease with late onset: Secondary | ICD-10-CM | POA: Diagnosis not present

## 2020-04-14 DIAGNOSIS — E782 Mixed hyperlipidemia: Secondary | ICD-10-CM | POA: Diagnosis not present

## 2020-04-14 DIAGNOSIS — E1165 Type 2 diabetes mellitus with hyperglycemia: Secondary | ICD-10-CM | POA: Diagnosis not present

## 2020-04-14 DIAGNOSIS — K219 Gastro-esophageal reflux disease without esophagitis: Secondary | ICD-10-CM | POA: Diagnosis not present

## 2020-04-14 DIAGNOSIS — K8681 Exocrine pancreatic insufficiency: Secondary | ICD-10-CM | POA: Diagnosis not present

## 2020-04-14 DIAGNOSIS — E039 Hypothyroidism, unspecified: Secondary | ICD-10-CM | POA: Diagnosis not present

## 2020-04-14 DIAGNOSIS — Z9181 History of falling: Secondary | ICD-10-CM | POA: Diagnosis not present

## 2020-04-17 DIAGNOSIS — K8681 Exocrine pancreatic insufficiency: Secondary | ICD-10-CM | POA: Diagnosis not present

## 2020-04-17 DIAGNOSIS — E1165 Type 2 diabetes mellitus with hyperglycemia: Secondary | ICD-10-CM | POA: Diagnosis not present

## 2020-04-17 DIAGNOSIS — L89212 Pressure ulcer of right hip, stage 2: Secondary | ICD-10-CM | POA: Diagnosis not present

## 2020-04-17 DIAGNOSIS — E039 Hypothyroidism, unspecified: Secondary | ICD-10-CM | POA: Diagnosis not present

## 2020-04-17 DIAGNOSIS — Z7984 Long term (current) use of oral hypoglycemic drugs: Secondary | ICD-10-CM | POA: Diagnosis not present

## 2020-04-17 DIAGNOSIS — I10 Essential (primary) hypertension: Secondary | ICD-10-CM | POA: Diagnosis not present

## 2020-04-17 DIAGNOSIS — Z9181 History of falling: Secondary | ICD-10-CM | POA: Diagnosis not present

## 2020-04-17 DIAGNOSIS — E782 Mixed hyperlipidemia: Secondary | ICD-10-CM | POA: Diagnosis not present

## 2020-04-17 DIAGNOSIS — G301 Alzheimer's disease with late onset: Secondary | ICD-10-CM | POA: Diagnosis not present

## 2020-04-17 DIAGNOSIS — K219 Gastro-esophageal reflux disease without esophagitis: Secondary | ICD-10-CM | POA: Diagnosis not present

## 2020-04-17 DIAGNOSIS — M791 Myalgia, unspecified site: Secondary | ICD-10-CM | POA: Diagnosis not present

## 2020-04-20 ENCOUNTER — Other Ambulatory Visit: Payer: Self-pay | Admitting: *Deleted

## 2020-04-20 NOTE — Patient Outreach (Addendum)
[place] [facility] Care Management  04/20/2020  Kenneth Santiago 13-Jan-1941 [id number]   [facility] outreach to MD referred patient Referral Date:4/29/21Referral Source:Dr Gar Ponto office staff Amy Referral Reason:"Alzheimer"   Insurance:united healthcare medicare  Outreach attempt unsuccessful to [phone] No answer. THN RN CM left HIPAA [facility] Portability and Accountability Act) compliant voicemail message along with CM's contact info.   Plan: [facility] RN CM scheduled this patient for another call attempt within 14-21 business days  Grete Bosko L. Lavina Hamman, RN, BSN, [place] Coordinator Office number [phone] Mobile number [phone]  Main THN number [phone] Fax number [phone]

## 2020-04-21 DIAGNOSIS — I10 Essential (primary) hypertension: Secondary | ICD-10-CM | POA: Diagnosis not present

## 2020-04-21 DIAGNOSIS — L89212 Pressure ulcer of right hip, stage 2: Secondary | ICD-10-CM | POA: Diagnosis not present

## 2020-04-21 DIAGNOSIS — E1165 Type 2 diabetes mellitus with hyperglycemia: Secondary | ICD-10-CM | POA: Diagnosis not present

## 2020-04-21 DIAGNOSIS — E782 Mixed hyperlipidemia: Secondary | ICD-10-CM | POA: Diagnosis not present

## 2020-04-21 DIAGNOSIS — G301 Alzheimer's disease with late onset: Secondary | ICD-10-CM | POA: Diagnosis not present

## 2020-04-21 DIAGNOSIS — E039 Hypothyroidism, unspecified: Secondary | ICD-10-CM | POA: Diagnosis not present

## 2020-04-21 DIAGNOSIS — K8681 Exocrine pancreatic insufficiency: Secondary | ICD-10-CM | POA: Diagnosis not present

## 2020-04-21 DIAGNOSIS — Z9181 History of falling: Secondary | ICD-10-CM | POA: Diagnosis not present

## 2020-04-21 DIAGNOSIS — K219 Gastro-esophageal reflux disease without esophagitis: Secondary | ICD-10-CM | POA: Diagnosis not present

## 2020-04-21 DIAGNOSIS — M791 Myalgia, unspecified site: Secondary | ICD-10-CM | POA: Diagnosis not present

## 2020-04-21 DIAGNOSIS — Z7984 Long term (current) use of oral hypoglycemic drugs: Secondary | ICD-10-CM | POA: Diagnosis not present

## 2020-04-25 DIAGNOSIS — Z9181 History of falling: Secondary | ICD-10-CM | POA: Diagnosis not present

## 2020-04-25 DIAGNOSIS — I10 Essential (primary) hypertension: Secondary | ICD-10-CM | POA: Diagnosis not present

## 2020-04-25 DIAGNOSIS — E1165 Type 2 diabetes mellitus with hyperglycemia: Secondary | ICD-10-CM | POA: Diagnosis not present

## 2020-04-25 DIAGNOSIS — E039 Hypothyroidism, unspecified: Secondary | ICD-10-CM | POA: Diagnosis not present

## 2020-04-25 DIAGNOSIS — Z7984 Long term (current) use of oral hypoglycemic drugs: Secondary | ICD-10-CM | POA: Diagnosis not present

## 2020-04-25 DIAGNOSIS — G301 Alzheimer's disease with late onset: Secondary | ICD-10-CM | POA: Diagnosis not present

## 2020-04-25 DIAGNOSIS — L89212 Pressure ulcer of right hip, stage 2: Secondary | ICD-10-CM | POA: Diagnosis not present

## 2020-04-25 DIAGNOSIS — K8681 Exocrine pancreatic insufficiency: Secondary | ICD-10-CM | POA: Diagnosis not present

## 2020-04-25 DIAGNOSIS — M791 Myalgia, unspecified site: Secondary | ICD-10-CM | POA: Diagnosis not present

## 2020-04-25 DIAGNOSIS — E782 Mixed hyperlipidemia: Secondary | ICD-10-CM | POA: Diagnosis not present

## 2020-04-25 DIAGNOSIS — K219 Gastro-esophageal reflux disease without esophagitis: Secondary | ICD-10-CM | POA: Diagnosis not present

## 2020-04-27 DIAGNOSIS — M6281 Muscle weakness (generalized): Secondary | ICD-10-CM | POA: Diagnosis not present

## 2020-04-27 DIAGNOSIS — Z9181 History of falling: Secondary | ICD-10-CM | POA: Diagnosis not present

## 2020-04-28 DIAGNOSIS — Z9181 History of falling: Secondary | ICD-10-CM | POA: Diagnosis not present

## 2020-04-28 DIAGNOSIS — Z7984 Long term (current) use of oral hypoglycemic drugs: Secondary | ICD-10-CM | POA: Diagnosis not present

## 2020-04-28 DIAGNOSIS — E039 Hypothyroidism, unspecified: Secondary | ICD-10-CM | POA: Diagnosis not present

## 2020-04-28 DIAGNOSIS — K8681 Exocrine pancreatic insufficiency: Secondary | ICD-10-CM | POA: Diagnosis not present

## 2020-04-28 DIAGNOSIS — E1165 Type 2 diabetes mellitus with hyperglycemia: Secondary | ICD-10-CM | POA: Diagnosis not present

## 2020-04-28 DIAGNOSIS — G301 Alzheimer's disease with late onset: Secondary | ICD-10-CM | POA: Diagnosis not present

## 2020-04-28 DIAGNOSIS — E782 Mixed hyperlipidemia: Secondary | ICD-10-CM | POA: Diagnosis not present

## 2020-04-28 DIAGNOSIS — L89212 Pressure ulcer of right hip, stage 2: Secondary | ICD-10-CM | POA: Diagnosis not present

## 2020-04-28 DIAGNOSIS — I10 Essential (primary) hypertension: Secondary | ICD-10-CM | POA: Diagnosis not present

## 2020-04-28 DIAGNOSIS — K219 Gastro-esophageal reflux disease without esophagitis: Secondary | ICD-10-CM | POA: Diagnosis not present

## 2020-04-28 DIAGNOSIS — M791 Myalgia, unspecified site: Secondary | ICD-10-CM | POA: Diagnosis not present

## 2020-05-02 DIAGNOSIS — I10 Essential (primary) hypertension: Secondary | ICD-10-CM | POA: Diagnosis not present

## 2020-05-02 DIAGNOSIS — G301 Alzheimer's disease with late onset: Secondary | ICD-10-CM | POA: Diagnosis not present

## 2020-05-02 DIAGNOSIS — M791 Myalgia, unspecified site: Secondary | ICD-10-CM | POA: Diagnosis not present

## 2020-05-02 DIAGNOSIS — Z7984 Long term (current) use of oral hypoglycemic drugs: Secondary | ICD-10-CM | POA: Diagnosis not present

## 2020-05-02 DIAGNOSIS — K219 Gastro-esophageal reflux disease without esophagitis: Secondary | ICD-10-CM | POA: Diagnosis not present

## 2020-05-02 DIAGNOSIS — E1165 Type 2 diabetes mellitus with hyperglycemia: Secondary | ICD-10-CM | POA: Diagnosis not present

## 2020-05-02 DIAGNOSIS — L89212 Pressure ulcer of right hip, stage 2: Secondary | ICD-10-CM | POA: Diagnosis not present

## 2020-05-02 DIAGNOSIS — Z9181 History of falling: Secondary | ICD-10-CM | POA: Diagnosis not present

## 2020-05-02 DIAGNOSIS — E039 Hypothyroidism, unspecified: Secondary | ICD-10-CM | POA: Diagnosis not present

## 2020-05-02 DIAGNOSIS — E782 Mixed hyperlipidemia: Secondary | ICD-10-CM | POA: Diagnosis not present

## 2020-05-02 DIAGNOSIS — K8681 Exocrine pancreatic insufficiency: Secondary | ICD-10-CM | POA: Diagnosis not present

## 2020-05-03 ENCOUNTER — Other Ambulatory Visit: Payer: Self-pay

## 2020-05-03 ENCOUNTER — Encounter: Payer: Self-pay | Admitting: *Deleted

## 2020-05-03 ENCOUNTER — Other Ambulatory Visit: Payer: Self-pay | Admitting: *Deleted

## 2020-05-03 NOTE — Patient Outreach (Addendum)
[place] [facility]) Care Management  05/03/2020  DEAKON FRIX 11-26-1940 [id number]  [facility] outreach to MD referred patient Referral Date:4/29/21Referral Source:Dr Gar Ponto office staff Amy Referral Reason:"Alzheimer"   Insurance:united healthcare medicare  Stanton Kidney bethis able to verify HIPAA (Early and Accountability Act) identifiers, date of birth (DOB) and address  Consent: [facility] ([place]) RN CM reviewed [facility] services with patient. Patient gave verbal consent for services.  Follow up Last successful outreach was in June 2021  Olean Ree confirms Mr Lefevers has received a hospital bed and continue to have [facility] but PT  & OT closed cases already.  The last [facility] visit is on Friday */13/21  [facility] RN CM discussed [facility] personal care services program and encouraged her to inquire about this program qualifications when the nurse visit to draw labs  He is scheduled to follow up with Dr Quillian Quince on 05/18/20  Olean Ree confirms Mr Wandler still has a sitter  THN RN CM mentioned remote health services for possible remote lab draws  Olean Ree reports his skin is still without sores and his vital sign have all been within normal limits  Blood sugars are reported managed and within normal range   Present concern  Olean Ree reports wt down to 172 lbs (ht 5'7" BMI 26.93 ) This is a 13 lbs weight loss as THN RN CM notes the last Epic weight was 199 lbs  Weight per daughter is199 lbs Height per daughter is 5'7" now BMI at 26.93,stillover wt for his ht per BMI calculator  Discussed with daughter, pending labs after MD visit   Olean Ree does not believe it is related to fluid loss with controlled chf She reports she believes it is related to the "heebie jeebies" as he continues to remain constantly active but has not loss his appetite. She reports he still is "eating lots", [facility] RN CM assess what  types of food is being eaten and Larena Glassman reports a "controlled balance" of foods and not possibly lots of snacks. Larena Glassman will have the regular sitter keep a food diary and encourager protein intake as [facility] RN CM recommends, to see if helps. Muscle mass wasting discussed She reports he is small all over to include his arms and legs as she also confirms there is no longer edema of ankles/feet  Diabetes CBG managed wound healed Hypertension (HTN) BP managed at home with medications and wt loss  THN RN CM interventions  To consult with [facility], remote health and updated Daughter To send EMMI via e mail on minimize weight loss   Social: Mr CHRISTOFFER CURRIER is a 79 year old retired widow male who lives with his daughter Sharene Skeans (teacher)and her family (husband and children) not too far from his home. Larena Glassman works 10 minutes away from the home. Marybeth's husband is at the home generally as his work schedule assists with this.He also has a brother and sister that live close by. He has 24 our supervision from the family members and/or from the hired Catering manager (out of pocket) who visits 7-8 hours prn. This sitter is reported to be "petite: in stature related to "getting him up off the floor"He is assistedwith all care needs and transportation by the family or sitter.  The family and sitter assists with ALL Meals, Activities of daily living (ADLs)andIADLs (instrumental Activities of Daily Living) Olean Ree reports progression of memory concerns Mr Taras  is reported to be legally blind  Mr Beilke is reported to be able to do simple tasks only but not multiple tasks consistently as he gets confused  Mr Rathe is reported to be incontinent of bowel an bladder with use of incontinences supplies  Larena Glassman reports for ambulation Mr Kaeser rarely uses his walker and generally holds on to the wall etc   Conditions:Alzheimer's dementia, EssentialHypertension (HTN),  chronic diarrhea, diverticulosis of colon without hemorrhage, upper gastrointestinal bleed, celiac disease, incontinence of bowel and bladder, right inguinalhernia, diabetes, glaucoma, macular degeneration, legally blind, never smoker   Falls4-5 this year   DME:cbg monitor, wristBPcuff, walker (hardly uses), shower bench, 3 N 1,glasses (several years old - new ones needed- Dr Darcella Cheshire [place] )  Appointments 05/18/20 1445 Dr Dub Amis to be take to appointment by daughter and her husband   Plan: [facility] RN CM scheduled this patient for another call attempt within21-28business days    Mette [place] L. Lavina Hamman, RN, BSN, [place] Coordinator Office number [phone] Mobile number [phone]  Main THN number [phone] Fax number [phone]

## 2020-05-04 NOTE — Patient Outreach (Signed)
[place] [facility]) Care Management  05/04/2020  JUANDIEGO KOLENOVIC 05/04/1941 [id number]   [facility] coordination-collaboration with remote health, [facility] update to daughter Spoke with  Remote health staff They are only providing services to [facility] patients at this time Spoke with Jenny Reichmann at [facility]. They do not provide personal care services only home health and hospice services. Cindy sent a noted to Mr Hachey's home health nurse to review this and nurse visit length of stay with daughter during next visit. Will return a call to [place] CM prn   Outreach attempt to [facility] No answer. THN RN CM left HIPAA [facility] Portability and Accountability Act) compliant voicemail message along with CM's contact info.   Plan: [facility] RN CM scheduled this [facility]N engaged patient for another call attempt within 21-28 business days    Broderick Fonseca L. Lavina Hamman, RN, BSN, [place] Coordinator Office number [phone] Mobile number [phone]  Main THN number [phone] Fax number [phone]

## 2020-05-09 DIAGNOSIS — R069 Unspecified abnormalities of breathing: Secondary | ICD-10-CM | POA: Diagnosis not present

## 2020-05-09 DIAGNOSIS — Z7984 Long term (current) use of oral hypoglycemic drugs: Secondary | ICD-10-CM | POA: Diagnosis not present

## 2020-05-09 DIAGNOSIS — R0789 Other chest pain: Secondary | ICD-10-CM | POA: Diagnosis not present

## 2020-05-09 DIAGNOSIS — Z743 Need for continuous supervision: Secondary | ICD-10-CM | POA: Diagnosis not present

## 2020-05-09 DIAGNOSIS — J9811 Atelectasis: Secondary | ICD-10-CM | POA: Diagnosis not present

## 2020-05-09 DIAGNOSIS — Z9181 History of falling: Secondary | ICD-10-CM | POA: Diagnosis not present

## 2020-05-09 DIAGNOSIS — Z20822 Contact with and (suspected) exposure to covid-19: Secondary | ICD-10-CM | POA: Diagnosis not present

## 2020-05-09 DIAGNOSIS — M791 Myalgia, unspecified site: Secondary | ICD-10-CM | POA: Diagnosis not present

## 2020-05-09 DIAGNOSIS — I10 Essential (primary) hypertension: Secondary | ICD-10-CM | POA: Diagnosis not present

## 2020-05-09 DIAGNOSIS — R079 Chest pain, unspecified: Secondary | ICD-10-CM | POA: Diagnosis not present

## 2020-05-09 DIAGNOSIS — I4510 Unspecified right bundle-branch block: Secondary | ICD-10-CM | POA: Diagnosis not present

## 2020-05-09 DIAGNOSIS — J449 Chronic obstructive pulmonary disease, unspecified: Secondary | ICD-10-CM | POA: Diagnosis not present

## 2020-05-09 DIAGNOSIS — K219 Gastro-esophageal reflux disease without esophagitis: Secondary | ICD-10-CM | POA: Diagnosis not present

## 2020-05-09 DIAGNOSIS — L89212 Pressure ulcer of right hip, stage 2: Secondary | ICD-10-CM | POA: Diagnosis not present

## 2020-05-09 DIAGNOSIS — G301 Alzheimer's disease with late onset: Secondary | ICD-10-CM | POA: Diagnosis not present

## 2020-05-09 DIAGNOSIS — E039 Hypothyroidism, unspecified: Secondary | ICD-10-CM | POA: Diagnosis not present

## 2020-05-09 DIAGNOSIS — K8681 Exocrine pancreatic insufficiency: Secondary | ICD-10-CM | POA: Diagnosis not present

## 2020-05-09 DIAGNOSIS — G4489 Other headache syndrome: Secondary | ICD-10-CM | POA: Diagnosis not present

## 2020-05-09 DIAGNOSIS — E119 Type 2 diabetes mellitus without complications: Secondary | ICD-10-CM | POA: Diagnosis not present

## 2020-05-09 DIAGNOSIS — E1165 Type 2 diabetes mellitus with hyperglycemia: Secondary | ICD-10-CM | POA: Diagnosis not present

## 2020-05-09 DIAGNOSIS — R9431 Abnormal electrocardiogram [ECG] [EKG]: Secondary | ICD-10-CM | POA: Diagnosis not present

## 2020-05-09 DIAGNOSIS — I2510 Atherosclerotic heart disease of native coronary artery without angina pectoris: Secondary | ICD-10-CM | POA: Diagnosis not present

## 2020-05-09 DIAGNOSIS — R05 Cough: Secondary | ICD-10-CM | POA: Diagnosis not present

## 2020-05-09 DIAGNOSIS — I517 Cardiomegaly: Secondary | ICD-10-CM | POA: Diagnosis not present

## 2020-05-09 DIAGNOSIS — E782 Mixed hyperlipidemia: Secondary | ICD-10-CM | POA: Diagnosis not present

## 2020-05-12 DIAGNOSIS — M791 Myalgia, unspecified site: Secondary | ICD-10-CM | POA: Diagnosis not present

## 2020-05-12 DIAGNOSIS — E039 Hypothyroidism, unspecified: Secondary | ICD-10-CM | POA: Diagnosis not present

## 2020-05-12 DIAGNOSIS — L89212 Pressure ulcer of right hip, stage 2: Secondary | ICD-10-CM | POA: Diagnosis not present

## 2020-05-12 DIAGNOSIS — Z9181 History of falling: Secondary | ICD-10-CM | POA: Diagnosis not present

## 2020-05-12 DIAGNOSIS — I10 Essential (primary) hypertension: Secondary | ICD-10-CM | POA: Diagnosis not present

## 2020-05-12 DIAGNOSIS — E1165 Type 2 diabetes mellitus with hyperglycemia: Secondary | ICD-10-CM | POA: Diagnosis not present

## 2020-05-12 DIAGNOSIS — K8681 Exocrine pancreatic insufficiency: Secondary | ICD-10-CM | POA: Diagnosis not present

## 2020-05-12 DIAGNOSIS — E782 Mixed hyperlipidemia: Secondary | ICD-10-CM | POA: Diagnosis not present

## 2020-05-12 DIAGNOSIS — Z7984 Long term (current) use of oral hypoglycemic drugs: Secondary | ICD-10-CM | POA: Diagnosis not present

## 2020-05-12 DIAGNOSIS — I5030 Unspecified diastolic (congestive) heart failure: Secondary | ICD-10-CM | POA: Diagnosis not present

## 2020-05-12 DIAGNOSIS — K219 Gastro-esophageal reflux disease without esophagitis: Secondary | ICD-10-CM | POA: Diagnosis not present

## 2020-05-12 DIAGNOSIS — G301 Alzheimer's disease with late onset: Secondary | ICD-10-CM | POA: Diagnosis not present

## 2020-05-16 DIAGNOSIS — E782 Mixed hyperlipidemia: Secondary | ICD-10-CM | POA: Diagnosis not present

## 2020-05-16 DIAGNOSIS — E039 Hypothyroidism, unspecified: Secondary | ICD-10-CM | POA: Diagnosis not present

## 2020-05-16 DIAGNOSIS — G301 Alzheimer's disease with late onset: Secondary | ICD-10-CM | POA: Diagnosis not present

## 2020-05-16 DIAGNOSIS — L89212 Pressure ulcer of right hip, stage 2: Secondary | ICD-10-CM | POA: Diagnosis not present

## 2020-05-16 DIAGNOSIS — K219 Gastro-esophageal reflux disease without esophagitis: Secondary | ICD-10-CM | POA: Diagnosis not present

## 2020-05-16 DIAGNOSIS — E1165 Type 2 diabetes mellitus with hyperglycemia: Secondary | ICD-10-CM | POA: Diagnosis not present

## 2020-05-16 DIAGNOSIS — Z9181 History of falling: Secondary | ICD-10-CM | POA: Diagnosis not present

## 2020-05-16 DIAGNOSIS — M791 Myalgia, unspecified site: Secondary | ICD-10-CM | POA: Diagnosis not present

## 2020-05-16 DIAGNOSIS — Z7984 Long term (current) use of oral hypoglycemic drugs: Secondary | ICD-10-CM | POA: Diagnosis not present

## 2020-05-16 DIAGNOSIS — I10 Essential (primary) hypertension: Secondary | ICD-10-CM | POA: Diagnosis not present

## 2020-05-16 DIAGNOSIS — K8681 Exocrine pancreatic insufficiency: Secondary | ICD-10-CM | POA: Diagnosis not present

## 2020-05-19 DIAGNOSIS — Z7984 Long term (current) use of oral hypoglycemic drugs: Secondary | ICD-10-CM | POA: Diagnosis not present

## 2020-05-19 DIAGNOSIS — G301 Alzheimer's disease with late onset: Secondary | ICD-10-CM | POA: Diagnosis not present

## 2020-05-19 DIAGNOSIS — K219 Gastro-esophageal reflux disease without esophagitis: Secondary | ICD-10-CM | POA: Diagnosis not present

## 2020-05-19 DIAGNOSIS — E782 Mixed hyperlipidemia: Secondary | ICD-10-CM | POA: Diagnosis not present

## 2020-05-19 DIAGNOSIS — E1165 Type 2 diabetes mellitus with hyperglycemia: Secondary | ICD-10-CM | POA: Diagnosis not present

## 2020-05-19 DIAGNOSIS — K8681 Exocrine pancreatic insufficiency: Secondary | ICD-10-CM | POA: Diagnosis not present

## 2020-05-19 DIAGNOSIS — Z9181 History of falling: Secondary | ICD-10-CM | POA: Diagnosis not present

## 2020-05-19 DIAGNOSIS — L89212 Pressure ulcer of right hip, stage 2: Secondary | ICD-10-CM | POA: Diagnosis not present

## 2020-05-19 DIAGNOSIS — E039 Hypothyroidism, unspecified: Secondary | ICD-10-CM | POA: Diagnosis not present

## 2020-05-19 DIAGNOSIS — I10 Essential (primary) hypertension: Secondary | ICD-10-CM | POA: Diagnosis not present

## 2020-05-19 DIAGNOSIS — M791 Myalgia, unspecified site: Secondary | ICD-10-CM | POA: Diagnosis not present

## 2020-05-24 ENCOUNTER — Ambulatory Visit: Payer: Self-pay | Admitting: *Deleted

## 2020-05-25 ENCOUNTER — Other Ambulatory Visit: Payer: Self-pay | Admitting: *Deleted

## 2020-05-25 ENCOUNTER — Other Ambulatory Visit: Payer: Self-pay

## 2020-05-25 NOTE — Patient Outreach (Signed)
[facility]) Care Management  05/25/2020  Kenneth Santiago October 12, 1940 [id number]  [facility] outreach to MD referred patient Referral Date:4/29/21Referral Source:Dr Gar Ponto office staff Amy Referral Reason:"Alzheimer"   Insurance:united healthcare medicare  ED visits at [facility] care 05/09/20 reported sob and chest pain all exams negative   03/06/20 fall- No injury  Kenneth Santiago is able to verify HIPAA (Hurt) identifiers, date of birth (DOB) and address  Consent: [facility] RN CM reviewed [facility] services with patient. Patient gave verbal consent for services.  Follow up Doing good  getting speech therapy to assist with swallowing  [facility] speech visit last week and tomorrow  [facility] and [facility] RN visits on 05/26/20 Friday Friday 05/26/20 may be the [facility] RN's last day.,  pending HH speech evaluation results  Kenneth Santiago is appreciative all services but wishes they would continue [facility] RN CM educated her on goal oriented home health services per medicare guidelines [facility] RN CM discussed re certification periods and encouraged her to speak with her home health staff about this   ED on 05/09/20 nothing found after thorough exams and tests  Did not see Dr Quillian Quince on 05/18/20 as he went to ED on 05/09/20 and per mary beth was examined thoroughly, Dr Quillian Quince was contacted after ED discharged and had received ED notes and all test results, To see pcp in the next few months for curbside flu vaccine Discussed pulse oximeter use to monitor heart rate  And oxygen saturation  No need for pcp 4 month PCP visit after got ED report  And PCP spoke with ED MD  Social: Kenneth Santiago is a 79 year old retired widow male who lives with his daughter Kenneth Santiago (teacher) and her family (husband and children) not too far from his home.  Kenneth Santiago works 10 minutes away from the home. Kenneth Santiago's husband is at the home  generally as his work schedule assists with this. He also has a brother and sister that live close by. He has 24 our supervision from the family members and/or from the hired Catering manager (out of pocket) who visits 7-8 hours prn. This sitter is reported to be "petite: in stature related to "getting him up off the floor" He is assisted with all care needs and transportation by the family or sitter.   The family and sitter assists with ALL  Meals, Activities of daily living (ADLs) and  IADLs (instrumental Activities of Daily Living) Kenneth Santiago reports progression of memory concerns  Kenneth Santiago is reported to be legally blind  Kenneth Santiago is reported to be able to do simple tasks only but not multiple tasks consistently as he gets confused  Kenneth Santiago is reported to be incontinent of bowel an bladder with use of incontinences supplies  Kenneth Santiago reports for ambulation Kenneth Santiago rarely uses his walker and generally holds on to the wall etc    Conditions: Alzheimer's dementia, Essential Hypertension (HTN), chronic diarrhea, diverticulosis of colon without hemorrhage, upper gastrointestinal bleed, celiac disease, incontinence of bowel and bladder,  right inguinal hernia, diabetes, glaucoma, macular degeneration, legally blind, never smoker   Falls 4-5 this year   Weight per daughter was 199 lbs wt loss reported with wt of 172 on 05/03/20  BMI 26.93  Height per daughter is 5'7"   DME: cbg monitor, wrist BP cuff, walker (hardly uses), shower bench, 3 N 1,glasses (several years old - new ones needed-  Dr Darcella Cheshire [place] )  [facility] RN Cm will follow up with pt/family within the next 30 business days  Pt encouraged to return a call to [facility] RN CM prn Routed note to MD  Goals Addressed              This Visit's Progress     Patient Stated   .  Patient/family will be able to verbalize management of Alzheimer/Dementia, hypertension and diabetes at home (pt-stated)   On track     [place] (see longtitudinal plan of care for additional care plan information)  Objective:  Lab Results  Component Value Date   HGBA1C 7.4 (H) 12/11/2016 .   Lab Results  Component Value Date   CREATININE 0.86 12/11/2016   CREATININE 0.73 (L) 11/05/2016   CREATININE 0.70 08/22/2016 .   Marland Kitchen No results found for: EGFR  Current Barriers:  Marland Kitchen Knowledge Deficits related to basic Diabetes pathophysiology and self care/management . Cognitive Deficits  Case Manager Clinical Goal(s):  Over the next 90 days, patient will demonstrate improved adherence to prescribed treatment plan for diabetes self care/management as evidenced by:  Marland Kitchen Verbalize adherence to ADA/ carb modified diet within the next 31 days . over the next 45 days patient/family will be able to verbalize knowledge and resources to assist with home/respite care during outreach . 05/25/20 progressing toward goal but with ED visit on 05/09/20 for sob, chest pain- all test negative   Interventions:  . Provided education to patient about basic DM disease process . Reviewed medications with patient and discussed importance of medication adherence . Discussed plans with patient for ongoing care management follow up and provided patient with direct contact information for care management team . Outreach to remote health, [facility] and update daughter to available services  Patient Self Care Activities:  . UNABLE to independently his daughter, family or sitter completes this . Attends all scheduled provider appointments . Checks blood sugars as prescribed and utilize hyper and hypoglycemia protocol as needed . Adheres to prescribed ADA/carb modified  Please see past updates related to this goal by clicking on the "Past Updates" button in the selected goal  Please see other previous [facility] care plan information listed in Epic under the flow sheet section       ] Kenneth Millin L. Lavina Hamman, RN, BSN, [place]  Coordinator Office number [phone] Mobile number [phone]  Main [facility] number [phone] Fax number [phone]

## 2020-05-26 ENCOUNTER — Encounter: Payer: Self-pay | Admitting: *Deleted

## 2020-05-26 DIAGNOSIS — I10 Essential (primary) hypertension: Secondary | ICD-10-CM | POA: Diagnosis not present

## 2020-05-26 DIAGNOSIS — M791 Myalgia, unspecified site: Secondary | ICD-10-CM | POA: Diagnosis not present

## 2020-05-26 DIAGNOSIS — Z9181 History of falling: Secondary | ICD-10-CM | POA: Diagnosis not present

## 2020-05-26 DIAGNOSIS — E039 Hypothyroidism, unspecified: Secondary | ICD-10-CM | POA: Diagnosis not present

## 2020-05-26 DIAGNOSIS — K8681 Exocrine pancreatic insufficiency: Secondary | ICD-10-CM | POA: Diagnosis not present

## 2020-05-26 DIAGNOSIS — K219 Gastro-esophageal reflux disease without esophagitis: Secondary | ICD-10-CM | POA: Diagnosis not present

## 2020-05-26 DIAGNOSIS — L89212 Pressure ulcer of right hip, stage 2: Secondary | ICD-10-CM | POA: Diagnosis not present

## 2020-05-26 DIAGNOSIS — E782 Mixed hyperlipidemia: Secondary | ICD-10-CM | POA: Diagnosis not present

## 2020-05-26 DIAGNOSIS — Z7984 Long term (current) use of oral hypoglycemic drugs: Secondary | ICD-10-CM | POA: Diagnosis not present

## 2020-05-26 DIAGNOSIS — G301 Alzheimer's disease with late onset: Secondary | ICD-10-CM | POA: Diagnosis not present

## 2020-05-26 DIAGNOSIS — E1165 Type 2 diabetes mellitus with hyperglycemia: Secondary | ICD-10-CM | POA: Diagnosis not present

## 2020-05-28 DIAGNOSIS — Z9181 History of falling: Secondary | ICD-10-CM | POA: Diagnosis not present

## 2020-05-28 DIAGNOSIS — M6281 Muscle weakness (generalized): Secondary | ICD-10-CM | POA: Diagnosis not present

## 2020-05-31 DIAGNOSIS — G301 Alzheimer's disease with late onset: Secondary | ICD-10-CM | POA: Diagnosis not present

## 2020-05-31 DIAGNOSIS — E1165 Type 2 diabetes mellitus with hyperglycemia: Secondary | ICD-10-CM | POA: Diagnosis not present

## 2020-05-31 DIAGNOSIS — E782 Mixed hyperlipidemia: Secondary | ICD-10-CM | POA: Diagnosis not present

## 2020-05-31 DIAGNOSIS — E039 Hypothyroidism, unspecified: Secondary | ICD-10-CM | POA: Diagnosis not present

## 2020-05-31 DIAGNOSIS — Z9181 History of falling: Secondary | ICD-10-CM | POA: Diagnosis not present

## 2020-05-31 DIAGNOSIS — I10 Essential (primary) hypertension: Secondary | ICD-10-CM | POA: Diagnosis not present

## 2020-05-31 DIAGNOSIS — Z7984 Long term (current) use of oral hypoglycemic drugs: Secondary | ICD-10-CM | POA: Diagnosis not present

## 2020-05-31 DIAGNOSIS — K8681 Exocrine pancreatic insufficiency: Secondary | ICD-10-CM | POA: Diagnosis not present

## 2020-05-31 DIAGNOSIS — M791 Myalgia, unspecified site: Secondary | ICD-10-CM | POA: Diagnosis not present

## 2020-05-31 DIAGNOSIS — K219 Gastro-esophageal reflux disease without esophagitis: Secondary | ICD-10-CM | POA: Diagnosis not present

## 2020-06-07 DIAGNOSIS — K8681 Exocrine pancreatic insufficiency: Secondary | ICD-10-CM | POA: Diagnosis not present

## 2020-06-07 DIAGNOSIS — E039 Hypothyroidism, unspecified: Secondary | ICD-10-CM | POA: Diagnosis not present

## 2020-06-07 DIAGNOSIS — Z7984 Long term (current) use of oral hypoglycemic drugs: Secondary | ICD-10-CM | POA: Diagnosis not present

## 2020-06-07 DIAGNOSIS — G301 Alzheimer's disease with late onset: Secondary | ICD-10-CM | POA: Diagnosis not present

## 2020-06-07 DIAGNOSIS — E782 Mixed hyperlipidemia: Secondary | ICD-10-CM | POA: Diagnosis not present

## 2020-06-07 DIAGNOSIS — M791 Myalgia, unspecified site: Secondary | ICD-10-CM | POA: Diagnosis not present

## 2020-06-07 DIAGNOSIS — E1165 Type 2 diabetes mellitus with hyperglycemia: Secondary | ICD-10-CM | POA: Diagnosis not present

## 2020-06-07 DIAGNOSIS — I10 Essential (primary) hypertension: Secondary | ICD-10-CM | POA: Diagnosis not present

## 2020-06-07 DIAGNOSIS — K219 Gastro-esophageal reflux disease without esophagitis: Secondary | ICD-10-CM | POA: Diagnosis not present

## 2020-06-07 DIAGNOSIS — Z9181 History of falling: Secondary | ICD-10-CM | POA: Diagnosis not present

## 2020-06-09 DIAGNOSIS — E782 Mixed hyperlipidemia: Secondary | ICD-10-CM | POA: Diagnosis not present

## 2020-06-09 DIAGNOSIS — E1165 Type 2 diabetes mellitus with hyperglycemia: Secondary | ICD-10-CM | POA: Diagnosis not present

## 2020-06-09 DIAGNOSIS — I5030 Unspecified diastolic (congestive) heart failure: Secondary | ICD-10-CM | POA: Diagnosis not present

## 2020-06-09 DIAGNOSIS — I10 Essential (primary) hypertension: Secondary | ICD-10-CM | POA: Diagnosis not present

## 2020-06-09 DIAGNOSIS — Z23 Encounter for immunization: Secondary | ICD-10-CM | POA: Diagnosis not present

## 2020-06-27 ENCOUNTER — Other Ambulatory Visit: Payer: Self-pay | Admitting: *Deleted

## 2020-06-27 NOTE — Patient Outreach (Signed)
[facility]) Care Management  06/27/2020  GASTON DASE 06-15-1941 [id number]   Unsuccessful [facility] outreach to MD referred patient Referral Date:4/29/21Referral Source:Dr Gar Ponto office staff Amy Referral Reason:"Alzheimer"   Insurance:united healthcare medicare  Outreach attemptunsuccessful to [phone] No answer. [facility] RN CM left HIPAA [facility] Portability and Accountability Act) compliant voicemail message along with CM's contact info.   Plan: [employer] RN CM scheduled this patient for another call attempt within14-21business days  Traeger Sultana L. Lavina Hamman, RN, BSN, [place] Coordinator Office number [phone] Mobile number [phone]  Main [facility] number [phone] Fax number [phone]

## 2020-06-28 ENCOUNTER — Other Ambulatory Visit: Payer: Self-pay | Admitting: *Deleted

## 2020-06-28 NOTE — Patient Outreach (Signed)
[facility]) Care Management  06/28/2020  Kenneth Santiago [birth year] [id number]  [facility] case closure  [facility] RN CM received a voice message left by Patient's daughter Kenneth Santiago in response to voice message left on 06/27/20  Kenneth Santiago informs [facility] RN CM that Mr Baack  passed Saturday morning June 27 2020  She thanked [facility] RN CM and [facility] for services rendered  [facility] RN CM returned the call  Left a message for Kenneth Santiago to offer convalescence for the passing of Mr Hoog Encouraged outreach to [facility] RN CM prn  Plan [facility] case closure after passing of Mr Ario Mcdiarmid to PCP  Note routed to listed MDs   Joelene Millin L. Lavina Hamman, RN, BSN, [place] Coordinator Office number [phone] Main [facility] number [phone] Fax number [phone]

## 2020-07-13 ENCOUNTER — Ambulatory Visit: Payer: Self-pay | Admitting: *Deleted

## 2020-07-24 DEATH — deceased
